# Patient Record
Sex: Female | Born: 1990
Health system: Southern US, Community
[De-identification: ages and names within clinical notes are randomized; demographics above are authoritative.]

## PROBLEM LIST (undated history)

## (undated) DIAGNOSIS — F419 Anxiety disorder, unspecified: Secondary | ICD-10-CM

## (undated) DIAGNOSIS — L709 Acne, unspecified: Secondary | ICD-10-CM

## (undated) HISTORY — PX: WISDOM TOOTH EXTRACTION: SHX21

## (undated) HISTORY — DX: Anxiety disorder, unspecified: F41.9

## (undated) HISTORY — PX: UPPER GI ENDOSCOPY: SHX6162

## (undated) HISTORY — PX: COLONOSCOPY: SHX174

## (undated) HISTORY — DX: Acne, unspecified: L70.9

---
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
## 2011-01-06 ENCOUNTER — Encounter: Payer: Self-pay | Admitting: Family Medicine

## 2011-01-06 ENCOUNTER — Ambulatory Visit (INDEPENDENT_AMBULATORY_CARE_PROVIDER_SITE_OTHER): Payer: Managed Care, Other (non HMO) | Admitting: Family Medicine

## 2011-01-06 VITALS — BP 100/70 | Temp 98.8°F | Wt 140.2 lb

## 2011-01-06 DIAGNOSIS — N760 Acute vaginitis: Secondary | ICD-10-CM

## 2011-01-06 MED ORDER — FLUCONAZOLE 150 MG PO TABS: 150.0000 mg | ORAL_TABLET | Freq: Once | ORAL | Status: AC

## 2011-01-06 NOTE — Patient Instructions (Signed)
This appears to be yeast Take the Diflucan- 1 today and then if you still have symptoms take the 2nd pill in 3 days We'll notify you of your lab results Call with any questions or concerns Use condoms- EVERY TIME! Welcome home!

## 2011-01-06 NOTE — Progress Notes (Signed)
  Subjective:    Patient ID: Tasha Hanna, female    DOB: [birth year], 20 y.o.   MRN: [id number]  HPI ? Yeast infxn- having vaginal itching and burning.  sxs started last night.  No recent bubble baths, change in soaps or tampons.  Just finished menses.  Having thick, white vaginal d/c.  Denies abdominal pain.  No concerns about STDs.   Review of Systems For ROS see HPI     Objective:   Physical Exam  Genitourinary: There is no rash on the right labia. There is no rash on the left labia. Cervix exhibits no motion tenderness, no discharge and no friability. No tenderness or bleeding around the vagina. Vaginal discharge found.       Thick, white vaginal discharge w/ visible yeast on internal walls          Assessment & Plan:

## 2011-01-07 ENCOUNTER — Encounter: Payer: Self-pay | Admitting: *Deleted

## 2011-01-07 LAB — WET PREP BY MOLECULAR PROBE: Gardnerella vaginalis: NEGATIVE

## 2011-01-11 NOTE — Assessment & Plan Note (Signed)
Pt's sxs and PE consistent w/ yeast.  Check labs to r/o STD given episode of unprotected sex.  Start diflucan and await other results.

## 2011-01-24 ENCOUNTER — Ambulatory Visit (INDEPENDENT_AMBULATORY_CARE_PROVIDER_SITE_OTHER): Payer: Managed Care, Other (non HMO) | Admitting: Family Medicine

## 2011-01-24 VITALS — BP 120/70 | Temp 99.0°F | Wt 143.1 lb

## 2011-01-24 DIAGNOSIS — R599 Enlarged lymph nodes, unspecified: Secondary | ICD-10-CM

## 2011-01-24 DIAGNOSIS — R590 Localized enlarged lymph nodes: Secondary | ICD-10-CM | POA: Insufficient documentation

## 2011-01-24 NOTE — Progress Notes (Signed)
  Subjective:    Patient ID: Tasha Hanna, female    DOB: 1991-01-09, 20 y.o.   MRN: [id number]  HPI Knot behind ear- first noticed 2 months ago.  Is increasing in size.  Not painful.  No drainage.  No hx of similar.  No other areas of concerns.   Review of Systems For ROS see HPI     Objective:   Physical Exam  Constitutional: She appears well-developed and well-nourished. No distress.  HENT:  Head: Normocephalic and atraumatic.  Nose: Nose normal.  Mouth/Throat: Oropharynx is clear and moist. No oropharyngeal exudate.  Lymphadenopathy:       Head (right side): No submental, no submandibular, no preauricular, no posterior auricular and no occipital adenopathy present.       Head (left side): Posterior auricular adenopathy present. No submental, no submandibular, no preauricular and no occipital adenopathy present.       Right cervical: No superficial cervical and no posterior cervical adenopathy present.      Left cervical: No superficial cervical and no posterior cervical adenopathy present.       Right: No supraclavicular adenopathy present.       Left: No supraclavicular adenopathy present.          Assessment & Plan:

## 2011-01-24 NOTE — Patient Instructions (Signed)
We'll call you with your ultrasound appt and notify you of your blood work At this time, there is nothing to do but watch it and wait Call with any questions or concerns Have a great summer!!

## 2011-01-24 NOTE — Assessment & Plan Note (Signed)
Most likely due to irritation from tattooing process and now possibly calcified.  Check CBC and get [place].  If labs and imaging normal will just continue to observe.

## 2011-01-25 ENCOUNTER — Ambulatory Visit (HOSPITAL_BASED_OUTPATIENT_CLINIC_OR_DEPARTMENT_OTHER)
Admission: RE | Admit: 2011-01-25 | Discharge: 2011-01-25 | Disposition: A | Discharge status: Home / Self Care | Payer: Managed Care, Other (non HMO) | Source: Ambulatory Visit | Attending: Family Medicine | Admitting: Family Medicine

## 2011-01-25 DIAGNOSIS — R221 Localized swelling, mass and lump, neck: Secondary | ICD-10-CM

## 2011-01-25 DIAGNOSIS — R220 Localized swelling, mass and lump, head: Secondary | ICD-10-CM

## 2011-01-25 DIAGNOSIS — R590 Localized enlarged lymph nodes: Secondary | ICD-10-CM

## 2011-01-25 LAB — CBC WITH DIFFERENTIAL/PLATELET
Basophils Relative: 0.4 % (ref 0.0–3.0)
Eosinophils Absolute: 0.1 10*3/uL (ref 0.0–0.7)
Eosinophils Relative: 2.6 % (ref 0.0–5.0)
HCT: 37.7 % (ref 36.0–46.0)
Hemoglobin: 12.8 g/dL (ref 12.0–15.0)
Lymphs Abs: 1.3 10*3/uL (ref 0.7–4.0)
MCHC: 33.9 g/dL (ref 30.0–36.0)
MCV: 83.6 fl (ref 78.0–100.0)
Monocytes Absolute: 0.3 10*3/uL (ref 0.1–1.0)
Neutro Abs: 2.3 10*3/uL (ref 1.4–7.7)
Neutrophils Relative %: 56.4 % (ref 43.0–77.0)
RBC: 4.5 Mil/uL (ref 3.87–5.11)
WBC: 4.1 10*3/uL — ABNORMAL LOW (ref 4.5–10.5)

## 2011-01-26 ENCOUNTER — Encounter: Payer: Self-pay | Admitting: *Deleted

## 2011-01-26 ENCOUNTER — Other Ambulatory Visit (HOSPITAL_BASED_OUTPATIENT_CLINIC_OR_DEPARTMENT_OTHER): Payer: Managed Care, Other (non HMO)

## 2011-03-07 ENCOUNTER — Encounter: Payer: Self-pay | Admitting: Family Medicine

## 2011-03-08 ENCOUNTER — Ambulatory Visit (INDEPENDENT_AMBULATORY_CARE_PROVIDER_SITE_OTHER): Payer: Managed Care, Other (non HMO) | Admitting: Internal Medicine

## 2011-03-08 VITALS — BP 110/70 | Temp 98.8°F | Wt 138.6 lb

## 2011-03-08 DIAGNOSIS — M791 Myalgia, unspecified site: Secondary | ICD-10-CM

## 2011-03-08 DIAGNOSIS — M6789 Other specified disorders of synovium and tendon, multiple sites: Secondary | ICD-10-CM

## 2011-03-08 MED ORDER — TRAMADOL HCL 50 MG PO TABS: 50.0000 mg | ORAL_TABLET | Freq: Four times a day (QID) | ORAL | Status: AC | PRN

## 2011-03-08 NOTE — Patient Instructions (Signed)
Always stretch and warmup before lifting weights. Use the pain medicine every 6 hours as needed and implement the other recommendations as noted.

## 2011-03-08 NOTE — Progress Notes (Signed)
  Subjective:    Patient ID: Tasha Hanna, female    DOB: 29-Aug-1990, 20 y.o.   MRN: [id number]  HPI Extremity pain Location:LUE in biceps & upper forearm Onset:7/14 Trigger/injury:working out 7/13 with weights, "more intense than usual". She did not hear or feel a "pop" Pain quality:sharp Pain severity:up to 7 Duration:only as long as arm extended Radiation:no Exacerbating factors:reaching out Treatment/response:NSAIDS, heat, massage w/o benefit Review of systems: Constitutional: no fever, chills, sweats, change in weight  Musculoskeletal:no    joint stiffness, redness. Elbow swollen swelling Skin:no rash, color or temperature change Neuro:  Some weakness; no numbness and tingling Heme:no lymphadenopathy; abnormal bruising or bleeding       Review of Systems     Objective:   Physical Exam she is in no acute distress; she is uncomfortable  She has no lymphadenopathy the head neck or axilla.  There is full range of motion of the neck.  Passive range of motion left upper extremity is normal. She describes pain when she extends the left upper extremity forward  She is tender over the biceps tendon and the biceps to palpation. Contracted biceps reveals no asymmetry compared to the right biceps.  Strength and deep tendon reflexes are normal.  No skin color change or temperature noted.  She has an exostosis below the right knee which she said is chronic.        Assessment & Plan:  #1 biceps tendon strain; no clinical evidence of muscle  or tendon rupture  Plan: Warm moist compresses or Zostrix cream to this area 3-4 X / day. Tramadol every 6 hours as needed. Light stretching with low weight (2 #). Glucosamine sulfate 1500 mg daily until the pain resolves

## 2011-04-04 ENCOUNTER — Ambulatory Visit (INDEPENDENT_AMBULATORY_CARE_PROVIDER_SITE_OTHER): Payer: Managed Care, Other (non HMO) | Admitting: Family Medicine

## 2011-04-04 ENCOUNTER — Encounter: Payer: Self-pay | Admitting: Family Medicine

## 2011-04-04 DIAGNOSIS — Z111 Encounter for screening for respiratory tuberculosis: Secondary | ICD-10-CM

## 2011-04-04 DIAGNOSIS — Z0110 Encounter for examination of ears and hearing without abnormal findings: Secondary | ICD-10-CM

## 2011-04-04 DIAGNOSIS — Z23 Encounter for immunization: Secondary | ICD-10-CM

## 2011-04-04 DIAGNOSIS — Z0000 Encounter for general adult medical examination without abnormal findings: Secondary | ICD-10-CM | POA: Insufficient documentation

## 2011-04-04 DIAGNOSIS — Z0100 Encounter for examination of eyes and vision without abnormal findings: Secondary | ICD-10-CM

## 2011-04-04 NOTE — Assessment & Plan Note (Signed)
Pt's PE WNL.  According to form for school she needs TB test, TDaP, and varicella titers.  All other requirements met.  Form completed.  Will mail results of titers.  Anticipatory guidance provided.

## 2011-04-04 NOTE — Progress Notes (Signed)
  Subjective:    Patient ID: Tasha Hanna, female    DOB: 04/16/1991, 20 y.o.   MRN: [id number]  HPI CPE- no concerns today.     Review of Systems Patient reports no vision/ hearing changes, adenopathy,fever, weight change,  persistant/recurrent hoarseness , swallowing issues, chest pain, palpitations, edema, persistant/recurrent cough, hemoptysis, dyspnea (rest/exertional/paroxysmal nocturnal), gastrointestinal bleeding (melena, rectal bleeding), abdominal pain, significant heartburn, bowel changes, GU symptoms (dysuria, hematuria, incontinence), Gyn symptoms (abnormal  bleeding, pain),  syncope, focal weakness, memory loss, numbness & tingling, skin/hair/nail changes, abnormal bruising or bleeding, anxiety, or depression.     Objective:   Physical Exam  General Appearance:    Alert, cooperative, no distress, appears stated age  Head:    Normocephalic, without obvious abnormality, atraumatic  Eyes:    PERRL, conjunctiva/corneas clear, EOM's intact, fundi    benign, both eyes  Ears:    Normal TM's and external ear canals, both ears  Nose:   Nares normal, septum midline, mucosa normal, no drainage    or sinus tenderness  Throat:   Lips, mucosa, and tongue normal; teeth and gums normal  Neck:   Supple, symmetrical, trachea midline, no adenopathy;    Thyroid: no enlargement/tenderness/nodules  Back:     Symmetric, no curvature, ROM normal, no CVA tenderness  Lungs:     Clear to auscultation bilaterally, respirations unlabored  Chest Wall:    No tenderness or deformity   Heart:    Regular rate and rhythm, S1 and S2 normal, no murmur, rub   or gallop  Breast Exam:    deferred  Abdomen:     Soft, non-tender, bowel sounds active all four quadrants,    no masses, no organomegaly  Genitalia:    Deferred  Rectal:    Deferred  Extremities:   Extremities normal, atraumatic, no cyanosis or edema  Pulses:   2+ and symmetric all extremities  Skin:   Skin color, texture, turgor normal, no rashes or  lesions  Lymph nodes:   Cervical, supraclavicular, and axillary nodes normal  Neurologic:   CNII-XII intact, normal strength, sensation and reflexes    throughout          Assessment & Plan:

## 2011-04-04 NOTE — Patient Instructions (Signed)
Schedule a nurse visit on Wed or Thurs to recheck TB test [facility] notify you of your lab results Call with any questions or concerns Good luck at school!

## 2011-04-07 ENCOUNTER — Telehealth: Payer: Self-pay

## 2011-04-07 LAB — TB SKIN TEST: Induration: 0

## 2011-04-07 NOTE — Telephone Encounter (Signed)
Message copied by Beverely Low on Thu Apr 07, 2011  4:26 PM ------      Message from: Sheliah Hatch      Created: Tue Apr 05, 2011  1:51 PM       Immune to chicken pox- please mail copy to pt

## 2011-04-07 NOTE — Telephone Encounter (Signed)
Labs mailed

## 2011-05-05 ENCOUNTER — Other Ambulatory Visit: Payer: Self-pay | Admitting: Family Medicine

## 2011-05-05 MED ORDER — NORGESTIMATE-ETH ESTRADIOL 0.25-35 MG-MCG PO TABS: 1.0000 | ORAL_TABLET | Freq: Every day | ORAL | Status: DC

## 2011-05-05 NOTE — Telephone Encounter (Signed)
Rx sent 

## 2011-06-13 ENCOUNTER — Other Ambulatory Visit: Payer: Self-pay | Admitting: Family Medicine

## 2011-06-15 ENCOUNTER — Other Ambulatory Visit: Payer: Self-pay | Admitting: *Deleted

## 2011-06-15 MED ORDER — NORGESTIMATE-ETH ESTRADIOL 0.25-35 MG-MCG PO TABS: 1.0000 | ORAL_TABLET | Freq: Every day | ORAL | Status: DC

## 2011-06-15 NOTE — Telephone Encounter (Signed)
Pt called for refill on Monoessa 28's noted no pap in record, called to ask if she has a gyn and when she had last pap. Left message for pt to call office

## 2011-06-15 NOTE — Telephone Encounter (Signed)
rx sent to pharmacy by e-script  

## 2011-06-15 NOTE — Telephone Encounter (Signed)
At 20 she may not need a pap.  1st pap should be 3 yrs after intercourse or age 20- whichever comes first.  Ok to refill.

## 2011-06-16 NOTE — Telephone Encounter (Signed)
Done 06/15/11

## 2011-07-18 IMAGING — US US SOFT TISSUE HEAD/NECK
1 series · 7 of 7 positions shown · non-contrast
Comparison: None.

CLINICAL DATA: The patient complains of a probable lump posterior
to the left ear for 2 months

ULTRASOUND OF HEAD/NECK SOFT TISSUES
TECHNIQUE: Ultrasound examination of the head and neck soft
tissues was performed in the area of clinical concern.

[Series 1: us soft tissue head/neck · 0.04mm/px · 7 of 7 slices shown]
[im 1/7]
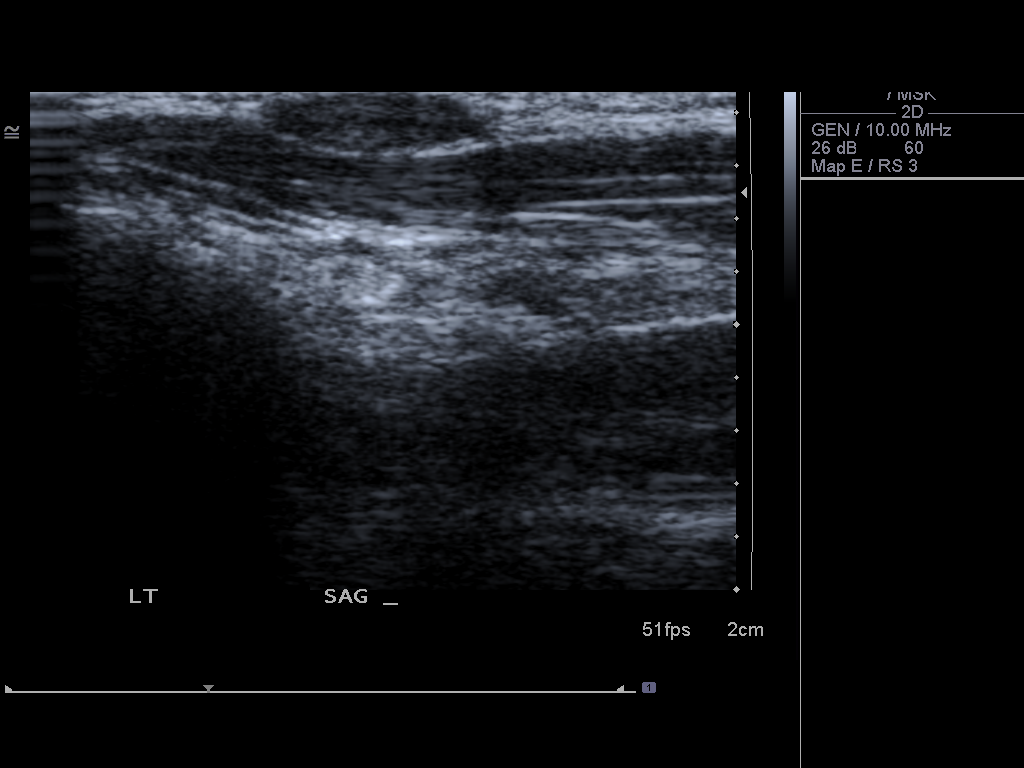
[im 2/7]
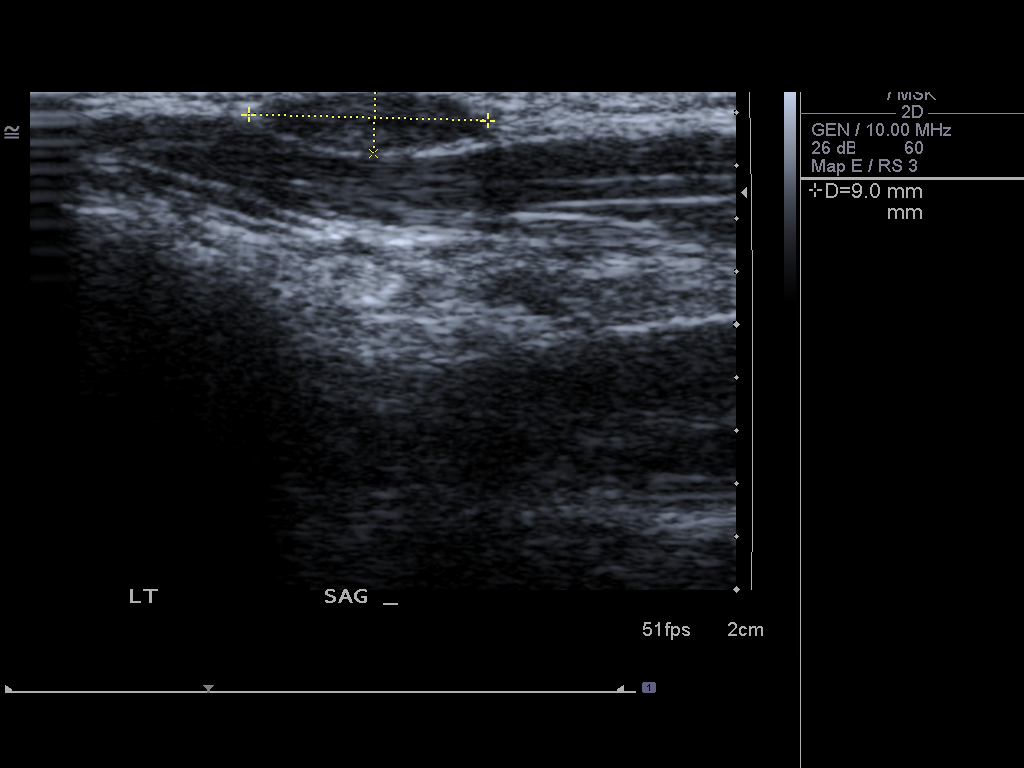
[im 3/7]
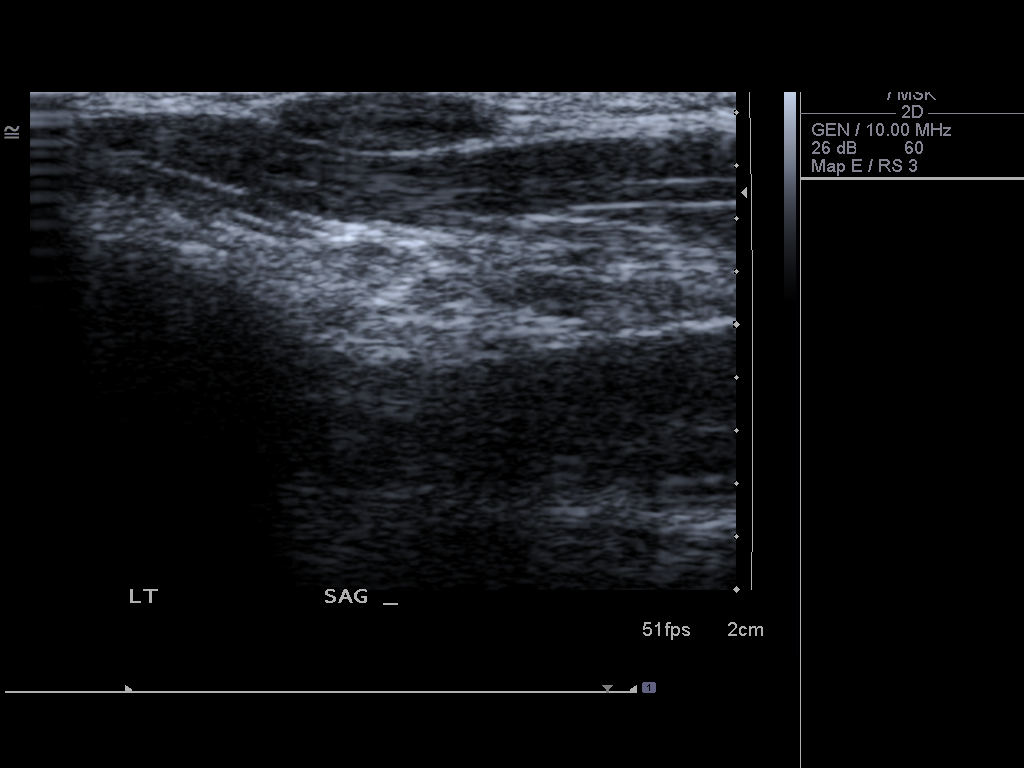
[im 4/7]
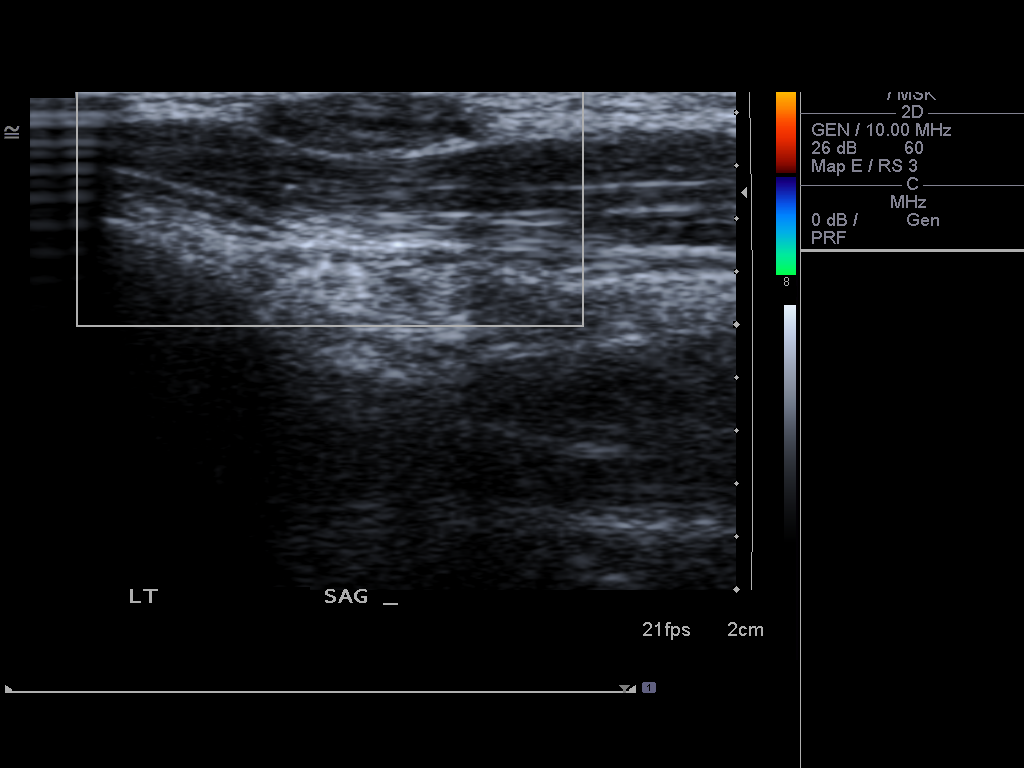
[im 5/7]
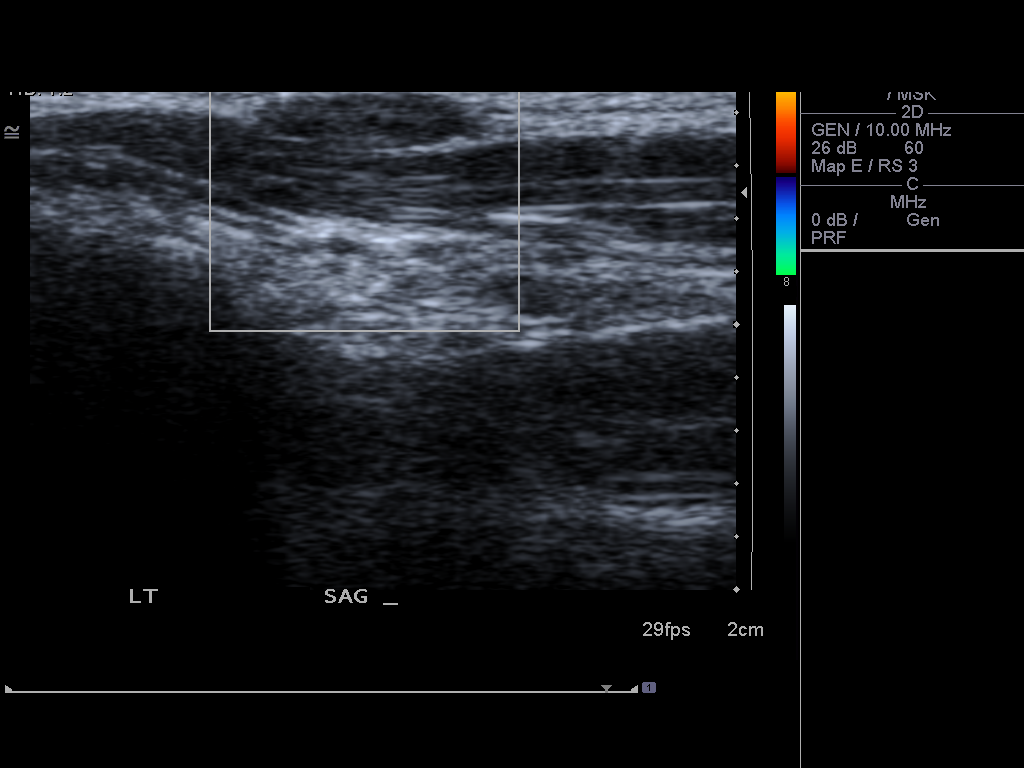
[im 6/7]
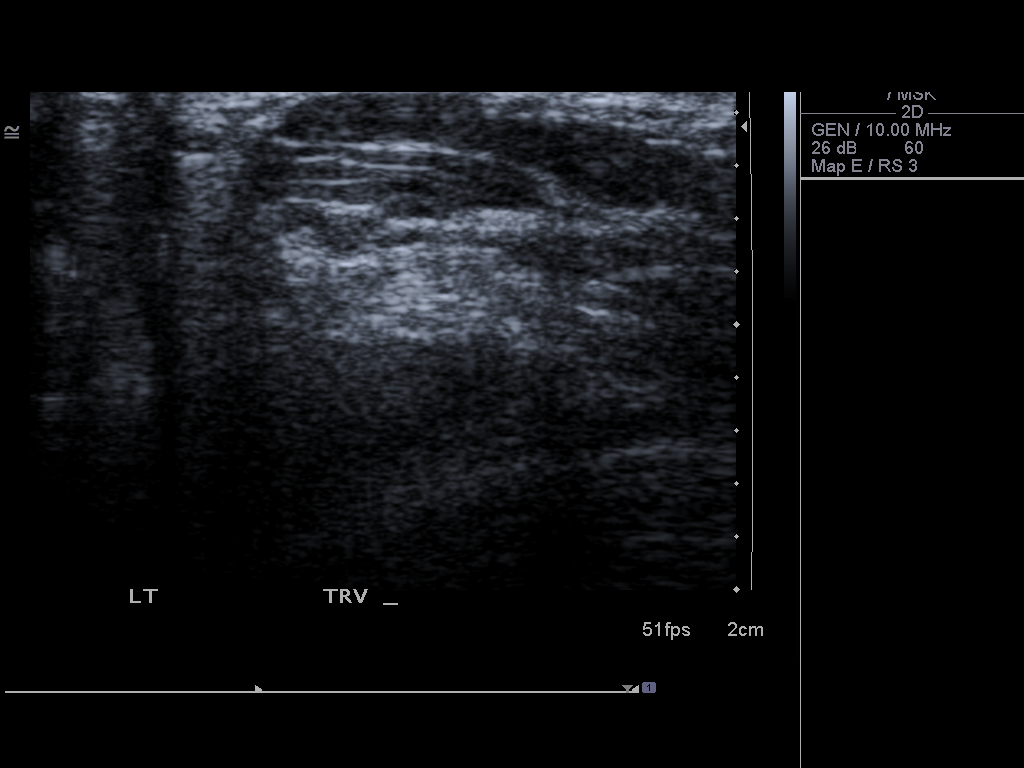
[im 7/7]
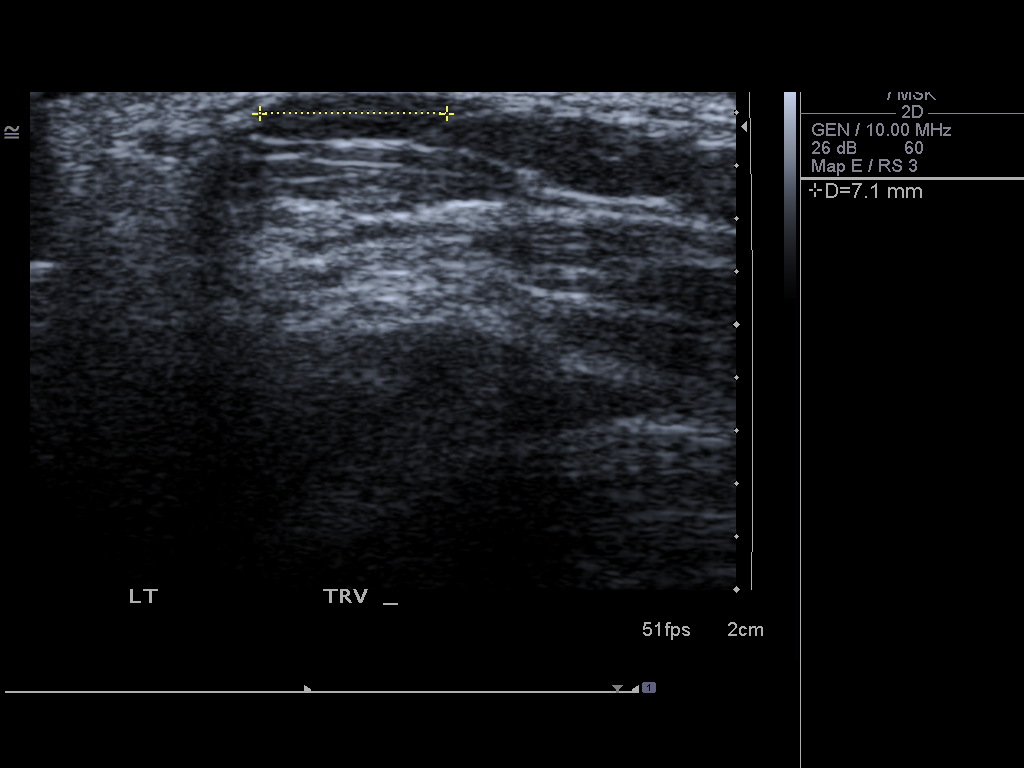

[7 of 7 positions shown; findings below may reference images not displayed]

FINDINGS: There is an oval relatively hypoechoic soft tissue nodule
corresponding to the palpable abnormality.  This is most consistent
with a lymph node of 9 x 3 x 7 mm.  No definite blood flow could be
demonstrated.
IMPRESSION: Probable lymph node at the site in question posterior to the left
ear.

## 2012-01-25 ENCOUNTER — Encounter: Payer: Self-pay | Admitting: Family Medicine

## 2012-01-25 ENCOUNTER — Ambulatory Visit (INDEPENDENT_AMBULATORY_CARE_PROVIDER_SITE_OTHER): Payer: Managed Care, Other (non HMO) | Admitting: Family Medicine

## 2012-01-25 VITALS — BP 120/75 | HR 77 | Temp 98.4°F | Ht 66.25 in | Wt 134.4 lb

## 2012-01-25 DIAGNOSIS — IMO0001 Reserved for inherently not codable concepts without codable children: Secondary | ICD-10-CM

## 2012-01-25 DIAGNOSIS — M2170 Unequal limb length (acquired), unspecified site: Secondary | ICD-10-CM

## 2012-01-25 DIAGNOSIS — Z309 Encounter for contraceptive management, unspecified: Secondary | ICD-10-CM

## 2012-01-25 MED ORDER — NORGESTIMATE-ETH ESTRADIOL 0.25-35 MG-MCG PO TABS: 1.0000 | ORAL_TABLET | Freq: Every day | ORAL | Status: DC

## 2012-01-25 NOTE — Assessment & Plan Note (Signed)
New.  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  L leg appears to be 1-2 cm longer.  Will refer to sports med for complete evaluation and tx.  Pt expressed understanding and is in agreement w/ plan.

## 2012-01-25 NOTE — Patient Instructions (Signed)
Schedule CPE when you get back Someone will call you with your sports med appt Have a great time!

## 2012-01-25 NOTE — Progress Notes (Signed)
  Subjective:    Patient ID: Tasha Hanna, female    DOB: 1991/05/13, 21 y.o.   MRN: [id number]  HPI Going to [place] to study abroad.  Leaving 7/29.  Will be there until 12/23 (5 months).  Was told she will need 5 month supply and note from MD indicating what medication is and what it is for in order to take in carry-on.  Leg Length Inequality- mom feels R hip is 'jutting out'.  Pt reports she regularly 'favors' one leg.  Has had increased back pain recently.  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 Constitutional: She appears well-developed and well-nourished. No distress.  Musculoskeletal: Normal range of motion. She exhibits no edema and no tenderness.       R leg appears to be 1.5 cm longer at medial malleoli L leg appears mildly bowlegged R hip jutting out more prominently than L  Psychiatric: She has a normal mood and affect. Her behavior is normal. Thought content normal.          Assessment & Plan:

## 2012-01-25 NOTE — Assessment & Plan Note (Signed)
Pt given new script for 6 pill packs at 1 time and letter for [employer] indicating why she is bringing large quantity of meds.  Pt to have CPE when she returns.

## 2012-02-01 ENCOUNTER — Encounter: Payer: Self-pay | Admitting: Family Medicine

## 2012-02-01 ENCOUNTER — Ambulatory Visit (INDEPENDENT_AMBULATORY_CARE_PROVIDER_SITE_OTHER): Payer: Managed Care, Other (non HMO) | Admitting: Family Medicine

## 2012-02-01 VITALS — BP 125/79 | HR 74 | Temp 98.1°F | Ht 66.0 in | Wt 130.0 lb

## 2012-02-01 DIAGNOSIS — M2170 Unequal limb length (acquired), unspecified site: Secondary | ICD-10-CM

## 2012-02-01 DIAGNOSIS — M545 Low back pain, unspecified: Secondary | ICD-10-CM

## 2012-02-01 DIAGNOSIS — R269 Unspecified abnormalities of gait and mobility: Secondary | ICD-10-CM

## 2012-02-01 DIAGNOSIS — M21619 Bunion of unspecified foot: Secondary | ICD-10-CM

## 2012-02-01 NOTE — Patient Instructions (Addendum)
While the measurement of your legs demonstrates they are equal, because of your pelvic tilt the left one appears slightly longer. We will try heel lift on this side as well as scaphoid pads to prevent overpronation. Bunion pads and 1st toe spacers to help with your bunion. Try SI joint stretches to help with stiffness in your low back. If these work well for you, I can show you how to order these pads from [employer] or make you custom orthotics.

## 2012-02-02 ENCOUNTER — Encounter: Payer: Self-pay | Admitting: Family Medicine

## 2012-02-02 NOTE — Progress Notes (Signed)
Subjective:    Patient ID: Tasha Hanna, female    DOB: 07-24-1991, 21 y.o.   MRN: [id number]  PCP: Dr. Beverely Low  HPI 21 yo F here for low back, left lower extremity complaints.  Patient here with mother. They state for the past few years she has suffered from intermittent upper back, low back, left knee, right knee, left foot pain.  Most of symptoms occur on left side. They do improve with rest. She turns left foot in more than the right. No prior treatment or surgeries for any of these areas. Known prior osgood schlatters on right. Occasionally takes advil. No complaints currently. No numbness/tingling of extremities. No bowel/bladder dysfunction. Questionable leg length discrepancy at recent visit with PCP.   Past Medical History  Diagnosis Date  . Acne     Dr Sherryl Barters NP    Current Outpatient Prescriptions on File Prior to Visit  Medication Sig Dispense Refill  . norgestimate-ethinyl estradiol (SPRINTEC 28) 0.25-35 MG-MCG tablet Take 1 tablet by mouth daily. Please provide 6 months b/c pt will be studying abroad  6 Package  0  . traMADol (ULTRAM) 50 MG tablet Take 1 tablet (50 mg total) by mouth every 6 (six) hours as needed for pain.  30 tablet  0    Past Surgical History  Procedure Date  . Wisdom tooth extraction     No Known Allergies  History   Social History  . Marital Status: Single    Spouse Name: N/A    Number of Children: N/A  . Years of Education: N/A   Occupational History  . Not on file.   Social History Main Topics  . Smoking status: Never Smoker   . Smokeless tobacco: Not on file  . Alcohol Use: Yes  . Drug Use: No  . Sexually Active: Not on file   Other Topics Concern  . Not on file   Social History Narrative   Lives at home with mom, dad, and brother.    Family History  Problem Relation Age of Onset  . Hypertension Father   . Diabetes Maternal Grandfather   . Heart attack Maternal Grandfather   . Stroke Maternal Grandfather   .  Cancer Maternal Grandfather     Bladder  . Prostate cancer Maternal Grandfather   . Diabetes Paternal Grandfather     BP 125/79  Pulse 74  Temp 98.1 F (36.7 C) (Oral)  Ht 5\' 6"  (1.676 m)  Wt 130 lb (58.968 kg)  BMI 20.98 kg/m2  LMP 01/11/2012  Review of Systems See HPI above.    Objective:   Physical Exam Gen: NAD  Back: No gross deformity, scoliosis. No paraspinal TTP .  No midline or bony TTP. FROM. Strength LEs 5/5 all muscle groups including hip abduction.   2+ MSRs in patellar and achilles tendons, equal bilaterally. Negative SLRs. Sensation intact to light touch bilaterally. Negative logroll bilateral hips Negative fabers and piriformis stretches though SI joints without much movement bilaterally with fabers. Leg lengths measured 86 1/2 cm from ASIS to medial malleolus bilaterally though visibly left appears 0.5cm shorter with feet aligned with hips. No trendelenberg gait.  L foot/ankle: Left bunion and hallux valgus. Mod overpronation on left, mild on right. No other gross deformity, swelling, ecchymoses FROM No TTP. Negative ant drawer and talar tilt.   Negative syndesmotic compression. Thompsons test negative. NV intact distally.  L knee: No gross deformity, ecchymoses, swelling. No TTP. FROM. Negative ant/post drawers. Negative valgus/varus testing. Negative lachmanns. Negative mcmurrays,  apleys, patellar apprehension. NV intact distally.    Assessment & Plan:  1. Low back pain - only abnormality currently on exam is SI joints are stiff - shown stretching exercises.  Discussed hip abduction, rotation exercises though her strength on exam is excellent grossly.  Footwear adjustments hopefully will help this as well.  2. Gait abnormality - By measurement leg lengths are equal though left appears about 0.5cm shorter when aligning with hips and evaluating location of medial malleoli.  As she does not have evidence of scoliosis this would suggest  imbalance related to pelvic tilt.  Will try heel lift on left with scaphoid pads bilaterally to help with overpronation.  3. Bunion - toe spacers given and bunion pad as well.

## 2012-02-06 DIAGNOSIS — R269 Unspecified abnormalities of gait and mobility: Secondary | ICD-10-CM | POA: Insufficient documentation

## 2012-02-06 DIAGNOSIS — M545 Low back pain, unspecified: Secondary | ICD-10-CM | POA: Insufficient documentation

## 2012-02-06 DIAGNOSIS — M21619 Bunion of unspecified foot: Secondary | ICD-10-CM | POA: Insufficient documentation

## 2012-02-06 NOTE — Assessment & Plan Note (Signed)
By measurement leg lengths are equal though left appears about 0.5cm shorter when aligning with hips and evaluating location of medial malleoli.  As she does not have evidence of scoliosis this would suggest imbalance related to pelvic tilt.  Will try heel lift on left with scaphoid pads bilaterally to help with overpronation.

## 2012-02-06 NOTE — Assessment & Plan Note (Signed)
toe spacers given and bunion pad as well.

## 2012-02-06 NOTE — Assessment & Plan Note (Signed)
only abnormality currently on exam is SI joints are stiff - shown stretching exercises.  Discussed hip abduction, rotation exercises though her strength on exam is excellent grossly.  Footwear adjustments hopefully will help this as well.

## 2012-03-13 ENCOUNTER — Telehealth: Payer: Self-pay | Admitting: Family Medicine

## 2012-03-13 NOTE — Telephone Encounter (Signed)
Patients mother states the pt is leaving the country and needs to get her birth control filled for 5 months. However, [employer] will only cover a 3 month supply. Pts mother is [facility] [address] and is inquiring if she can get 2 more refills and pay out of pocket. Please advise,

## 2012-03-14 MED ORDER — NORGESTIMATE-ETH ESTRADIOL 0.25-35 MG-MCG PO TABS
1.0000 | ORAL_TABLET | Freq: Every day | ORAL | Status: DC
Start: 2012-03-14 — End: 2012-03-14

## 2012-03-14 MED ORDER — NORGESTIMATE-ETH ESTRADIOL 0.25-35 MG-MCG PO TABS: 1.0000 | ORAL_TABLET | Freq: Every day | ORAL | Status: DC

## 2012-03-14 NOTE — Telephone Encounter (Signed)
Tell pt to take script to [facility] and she doesn't need to run it through insurance and it is $10

## 2012-03-14 NOTE — Telephone Encounter (Signed)
Called pt mother to advise notation from pt recent OV as follows:  Pt given new script for 6 pill packs at 1 time and letter for [employer] indicating why she is bringing large quantity of meds. Pt to have CPE when she returns. Also noted the RX was sent to [facility], pt mother advised the insurance would only overide for 3 packages and was in search to find out if she could take the hard script for the remaining 2 with her to [place] to be filled, advised that I will ask MD Beverely Low if she is aware of any protocol for out of country Rx refills and contact pt back, at this time the 3packages have been sent via [facility] [address] please advise

## 2012-03-14 NOTE — Telephone Encounter (Signed)
Pt advised that she is already going to pick up the [facility] and wants the other 2 refills sent to  [facility] [address], sent via escribe and added notation to not fill through insurance. Pt understood.

## 2012-09-13 ENCOUNTER — Ambulatory Visit (INDEPENDENT_AMBULATORY_CARE_PROVIDER_SITE_OTHER): Payer: Managed Care, Other (non HMO) | Admitting: Family Medicine

## 2012-09-13 ENCOUNTER — Encounter: Payer: Self-pay | Admitting: Family Medicine

## 2012-09-13 VITALS — BP 120/86 | HR 81 | Wt 147.0 lb

## 2012-09-13 DIAGNOSIS — R1013 Epigastric pain: Secondary | ICD-10-CM

## 2012-09-13 MED ORDER — SUCRALFATE 1 G PO TABS: 1.0000 g | ORAL_TABLET | Freq: Four times a day (QID) | ORAL | Status: DC

## 2012-09-13 MED ORDER — OMEPRAZOLE 20 MG PO CPDR: 20.0000 mg | DELAYED_RELEASE_CAPSULE | Freq: Every day | ORAL | Status: DC

## 2012-09-13 NOTE — Assessment & Plan Note (Signed)
New.  Suspect gastritis due to recent binge drinking w/ excessive vomiting.  Due to epigastric TTP will check pancreatic enzymes.  Due to ETOH use will check LFTs.  Start PPI and carafate short term to allow mucosal healing.  Cautioned pt against binge drinking.  Will follow.

## 2012-09-13 NOTE — Progress Notes (Signed)
  Subjective:    Patient ID: Tasha Hanna, female    DOB: [birth year], 22 y.o.   MRN: [id number]  HPI abd pain- sxs started 1 week ago after getting sick from drinking ETOH.  Excessive vomiting per friends report but pt doesn't recall.  Has had epigastric pain and nausea since.  No improvement w/ eating.  Some improvement w/ sprite.  No fevers.  No diarrhea, constipation.  No TTP.  No radiation of pain into chest.  No hx of similar.  Has not drank since.  Prior to drinking binge was in [place] for 5 months and drinking sporadically.   Review of Systems For ROS see HPI     Objective:   Physical Exam  Constitutional: She is oriented to person, place, and time. She appears well-developed and well-nourished. No distress.  HENT:  Head: Normocephalic and atraumatic.  Eyes: Conjunctivae normal and EOM are normal. Pupils are equal, round, and reactive to light.  Neck: Normal range of motion. Neck supple. No thyromegaly present.  Cardiovascular: Normal rate, regular rhythm, normal heart sounds and intact distal pulses.   No murmur heard. Pulmonary/Chest: Effort normal and breath sounds normal. No respiratory distress.  Abdominal: Soft. Bowel sounds are normal. She exhibits no distension and no mass. There is tenderness (mild epigastric TTP). There is no rebound and no guarding.  Musculoskeletal: She exhibits no edema.  Lymphadenopathy:    She has no cervical adenopathy.  Neurological: She is alert and oriented to person, place, and time.  Skin: Skin is warm and dry.  Psychiatric: She has a normal mood and affect. Her behavior is normal.          Assessment & Plan:

## 2012-09-13 NOTE — Patient Instructions (Addendum)
Start the Omeprazole daily to decrease acid Use the Carafate prior to eating and before bed to protect the stomach Limit alcohol, acidic (tomatos, citrus), and spicy foods until you feel better Drink plenty of fluids We'll notify you of your lab results Call with any questions or concerns Hang in there!

## 2012-09-14 ENCOUNTER — Encounter: Payer: Self-pay | Admitting: *Deleted

## 2012-09-14 ENCOUNTER — Telehealth: Payer: Self-pay | Admitting: Family Medicine

## 2012-09-14 LAB — BASIC METABOLIC PANEL
BUN: 11 mg/dL (ref 6–23)
CO2: 27 mEq/L (ref 19–32)
Calcium: 9.8 mg/dL (ref 8.4–10.5)
Creatinine, Ser: 0.7 mg/dL (ref 0.4–1.2)
GFR: 116.74 mL/min (ref >60.00)
Glucose, Bld: 94 mg/dL (ref 70–99)

## 2012-09-14 LAB — CBC WITH DIFFERENTIAL/PLATELET
Basophils Relative: 0.2 % (ref 0.0–3.0)
Eosinophils Relative: 0.8 % (ref 0.0–5.0)
HCT: 42.9 % (ref 36.0–46.0)
Hemoglobin: 14.3 g/dL (ref 12.0–15.0)
Lymphs Abs: 3.1 10*3/uL (ref 0.7–4.0)
MCV: 87.6 fl (ref 78.0–100.0)
Monocytes Absolute: 0.3 10*3/uL (ref 0.1–1.0)
Monocytes Relative: 3.5 % (ref 3.0–12.0)
Neutro Abs: 5.1 10*3/uL (ref 1.4–7.7)
WBC: 8.7 10*3/uL (ref 4.5–10.5)

## 2012-09-14 LAB — HEPATIC FUNCTION PANEL
Albumin: 4.6 g/dL (ref 3.5–5.2)
Alkaline Phosphatase: 50 U/L (ref 39–117)
Total Protein: 8.5 g/dL — ABNORMAL HIGH (ref 6.0–8.3)

## 2012-09-14 LAB — LIPASE: Lipase: 19 U/L (ref 11.0–59.0)

## 2012-09-14 LAB — AMYLASE: Amylase: 54 U/L (ref 27–131)

## 2012-09-14 LAB — H. PYLORI ANTIBODY, IGG: H Pylori IgG: NEGATIVE

## 2012-09-14 MED ORDER — SUCRALFATE 1 G PO TABS: 1.0000 g | ORAL_TABLET | Freq: Four times a day (QID) | ORAL | Status: DC

## 2012-09-14 MED ORDER — OMEPRAZOLE 20 MG PO CPDR: 20.0000 mg | DELAYED_RELEASE_CAPSULE | Freq: Every day | ORAL | Status: DC

## 2012-09-14 NOTE — Telephone Encounter (Signed)
mom called stated none of the RXs have been received at the pharmacy. reviewed pharmacy it shows sent to pt stated they do not use  I have updated pts chart to reflect the pnly pharmacy they use which is below. Can both be sent today to the phamracy below??   [id number] - [place], [place] - [address]

## 2012-09-14 NOTE — Telephone Encounter (Signed)
Rx sent to correct pharmacy, all other pharmacy deleted from Pt profile.

## 2012-09-19 ENCOUNTER — Telehealth: Payer: Self-pay | Admitting: *Deleted

## 2012-09-19 NOTE — Telephone Encounter (Signed)
Called initiated PA awaiting fax form.

## 2012-09-24 NOTE — Telephone Encounter (Signed)
Form completed and faxed, per Dr Beverely Low Pt can try OTC. Pt made aware.

## 2012-09-28 NOTE — Telephone Encounter (Signed)
PA was denied because current plan does not allow coverage of Prilosec if the patient does not have any of the following frequent and severe symptoms of chronic gastroesophageal reflux disease GERD; Atypical symptoms or complications of GERD; GERD with an inadequate treatment. Denial letter scan to chart.

## 2012-10-03 ENCOUNTER — Ambulatory Visit (INDEPENDENT_AMBULATORY_CARE_PROVIDER_SITE_OTHER): Payer: Managed Care, Other (non HMO) | Admitting: Family Medicine

## 2012-10-03 ENCOUNTER — Encounter: Payer: Self-pay | Admitting: Family Medicine

## 2012-10-03 VITALS — BP 100/80 | HR 91 | Temp 98.3°F | Ht 65.75 in | Wt 150.0 lb

## 2012-10-03 DIAGNOSIS — J020 Streptococcal pharyngitis: Secondary | ICD-10-CM

## 2012-10-03 MED ORDER — PREDNISONE 20 MG PO TABS: ORAL_TABLET | ORAL | Status: DC

## 2012-10-03 MED ORDER — AMOXICILLIN 500 MG PO CAPS: 500.0000 mg | ORAL_CAPSULE | Freq: Two times a day (BID) | ORAL | Status: DC

## 2012-10-03 NOTE — Progress Notes (Signed)
  Subjective:    Patient ID: Tasha Hanna, female    DOB: 04/08/1991, 21 y.o.   MRN: [id number]  HPI Sore throat- sxs started Friday and 'it just keeps getting worse'.  Went to [facility] yesterday (Tuesday) and had negative strep test, awaiting culture results.  Throat is much worse this AM.  No relief w/ ibuprofen, tylenol.  No fever, mild nasal congestion, dry cough.  + sick contacts.   Review of Systems For ROS see HPI     Objective:   Physical Exam  Vitals reviewed. Constitutional: She appears well-developed and well-nourished. No distress.  HENT:  Head: Normocephalic and atraumatic.  Nose: Nose normal.  TMs normal bilaterally No TTP over sinuses Bilateral tonsillar enlargement w/ erythema and exudate  Neck: Normal range of motion. Neck supple.  Cardiovascular: Normal rate, regular rhythm and normal heart sounds.   Pulmonary/Chest: Effort normal and breath sounds normal. No respiratory distress. She has no wheezes. She has no rales.  Lymphadenopathy:    She has cervical adenopathy.          Assessment & Plan:

## 2012-10-03 NOTE — Patient Instructions (Addendum)
This is strep- start the Amox twice daily, take w/ food Use the prednisone to decrease pain and inflammation- take w/ food FLUIDS! REST! Hang in there!

## 2012-10-07 NOTE — Assessment & Plan Note (Addendum)
New.  Pt's sxs consistent and rapid strep test +.  Start abx.  Prednisone as directed for pain and inflammation.  Pt expressed understanding and is in agreement w/ plan.

## 2012-11-13 ENCOUNTER — Telehealth: Payer: Self-pay | Admitting: Family Medicine

## 2012-11-13 NOTE — Telephone Encounter (Signed)
Ok to provide 1 yr of pills but needs to schedule CPE when able (no pap until 97)

## 2012-11-13 NOTE — Telephone Encounter (Signed)
Patient's mother called stating the patient's rx for sprintec 28 is expired. They would like a new rx sent to [facility] on [address].

## 2012-11-13 NOTE — Telephone Encounter (Signed)
Dr.Tabori please advise, no pap on file, no recent CPX although patient was recently seen acutely for sore throat on 10/03/2012

## 2012-11-14 MED ORDER — NORGESTIMATE-ETH ESTRADIOL 0.25-35 MG-MCG PO TABS: 1.0000 | ORAL_TABLET | Freq: Every day | ORAL | Status: DC

## 2012-11-14 NOTE — Telephone Encounter (Signed)
Rx faxed, please offer this patient a CPE        KP

## 2012-11-19 NOTE — Telephone Encounter (Signed)
Pt scheduled for cpe/w pap in August.

## 2013-04-19 ENCOUNTER — Encounter: Payer: Managed Care, Other (non HMO) | Admitting: Family Medicine

## 2013-06-12 ENCOUNTER — Encounter: Payer: Managed Care, Other (non HMO) | Admitting: Family Medicine

## 2013-07-08 ENCOUNTER — Telehealth: Payer: Self-pay

## 2013-07-08 NOTE — Telephone Encounter (Signed)
Left message for call back  identifiable     

## 2013-07-09 ENCOUNTER — Encounter: Payer: Self-pay | Admitting: Family Medicine

## 2013-07-09 ENCOUNTER — Other Ambulatory Visit (HOSPITAL_COMMUNITY)
Admission: RE | Admit: 2013-07-09 | Discharge: 2013-07-09 | Disposition: A | Discharge status: Home / Self Care | Payer: Managed Care, Other (non HMO) | Source: Ambulatory Visit | Attending: Family Medicine | Admitting: Family Medicine

## 2013-07-09 ENCOUNTER — Ambulatory Visit (INDEPENDENT_AMBULATORY_CARE_PROVIDER_SITE_OTHER): Payer: Managed Care, Other (non HMO) | Admitting: Family Medicine

## 2013-07-09 VITALS — BP 120/76 | HR 78 | Temp 98.2°F | Resp 16 | Ht 66.25 in | Wt 135.1 lb

## 2013-07-09 DIAGNOSIS — Z01419 Encounter for gynecological examination (general) (routine) without abnormal findings: Secondary | ICD-10-CM

## 2013-07-09 DIAGNOSIS — Z124 Encounter for screening for malignant neoplasm of cervix: Secondary | ICD-10-CM

## 2013-07-09 DIAGNOSIS — Z113 Encounter for screening for infections with a predominantly sexual mode of transmission: Secondary | ICD-10-CM | POA: Insufficient documentation

## 2013-07-09 DIAGNOSIS — Z1151 Encounter for screening for human papillomavirus (HPV): Secondary | ICD-10-CM | POA: Insufficient documentation

## 2013-07-09 DIAGNOSIS — Z0000 Encounter for general adult medical examination without abnormal findings: Secondary | ICD-10-CM

## 2013-07-09 LAB — CBC WITH DIFFERENTIAL/PLATELET
Basophils Absolute: 0.1 10*3/uL (ref 0.0–0.1)
Eosinophils Absolute: 0.1 10*3/uL (ref 0.0–0.7)
Hemoglobin: 13.5 g/dL (ref 12.0–15.0)
Lymphocytes Relative: 24.9 % (ref 12.0–46.0)
MCHC: 33.6 g/dL (ref 30.0–36.0)
Neutro Abs: 6.1 10*3/uL (ref 1.4–7.7)
Platelets: 264 10*3/uL (ref 150.0–400.0)
RDW: 12.9 % (ref 11.5–14.6)

## 2013-07-09 LAB — HEPATIC FUNCTION PANEL
ALT: 10 U/L (ref 0–35)
Alkaline Phosphatase: 35 U/L — ABNORMAL LOW (ref 39–117)
Bilirubin, Direct: 0.2 mg/dL (ref 0.0–0.3)
Total Bilirubin: 0.9 mg/dL (ref 0.3–1.2)

## 2013-07-09 LAB — BASIC METABOLIC PANEL
Calcium: 9.6 mg/dL (ref 8.4–10.5)
Chloride: 103 mEq/L (ref 96–112)
Creatinine, Ser: 0.7 mg/dL (ref 0.4–1.2)
Sodium: 136 mEq/L (ref 135–145)

## 2013-07-09 LAB — LIPID PANEL
Cholesterol: 172 mg/dL (ref 0–200)
LDL Cholesterol: 80 mg/dL (ref 0–99)
Triglycerides: 111 mg/dL (ref 0.0–149.0)
VLDL: 22.2 mg/dL (ref 0.0–40.0)

## 2013-07-09 NOTE — Assessment & Plan Note (Signed)
Pap collected. 

## 2013-07-09 NOTE — Progress Notes (Signed)
Pre visit review using our clinic review tool, if applicable. No additional management support is needed unless otherwise documented below in the visit note. 

## 2013-07-09 NOTE — Assessment & Plan Note (Signed)
Pt's PE WNL.  Breast and pap exam done today.  Check labs.  Anticipatory guidance provided.

## 2013-07-09 NOTE — Progress Notes (Signed)
  Subjective:    Patient ID: Tasha Hanna, female    DOB: 1990-10-01, 22 y.o.   MRN: [id number]  HPI Pre visit review using our clinic review tool, if applicable. No additional management support is needed unless otherwise documented below in the visit note.   CPE- no concerns.   Review of Systems Patient reports no vision/ hearing changes, adenopathy,fever, weight change,  persistant/recurrent hoarseness , swallowing issues, chest pain, palpitations, edema, persistant/recurrent cough, hemoptysis, dyspnea (rest/exertional/paroxysmal nocturnal), gastrointestinal bleeding (melena, rectal bleeding), abdominal pain, significant heartburn, bowel changes, GU symptoms (dysuria, hematuria, incontinence), Gyn symptoms (abnormal  bleeding, pain),  syncope, focal weakness, memory loss, numbness & tingling, skin/hair/nail changes, abnormal bruising or bleeding, anxiety, or depression.     Objective:   Physical Exam  General Appearance:    Alert, cooperative, no distress, appears stated age  Head:    Normocephalic, without obvious abnormality, atraumatic  Eyes:    PERRL, conjunctiva/corneas clear, EOM's intact, fundi    benign, both eyes  Ears:    Normal TM's and external ear canals, both ears  Nose:   Nares normal, septum midline, mucosa normal, no drainage    or sinus tenderness  Throat:   Lips, mucosa, and tongue normal; teeth and gums normal  Neck:   Supple, symmetrical, trachea midline, no adenopathy;    Thyroid: no enlargement/tenderness/nodules  Back:     Symmetric, no curvature, ROM normal, no CVA tenderness  Lungs:     Clear to auscultation bilaterally, respirations unlabored  Chest Wall:    No tenderness or deformity   Heart:    Regular rate and rhythm, S1 and S2 normal, no murmur, rub   or gallop  Breast Exam:    No tenderness, masses, or nipple abnormality  Abdomen:     Soft, non-tender, bowel sounds active all four quadrants,    no masses, no organomegaly  Genitalia:    External  genitalia normal, cervix normal in appearance, no CMT, uterus in normal size and position, adnexa w/out mass or tenderness, mucosa pink and moist, no lesions or discharge present  Rectal:    Normal external appearance  Extremities:   Extremities normal, atraumatic, no cyanosis or edema  Pulses:   2+ and symmetric all extremities  Skin:   Skin color, texture, turgor normal, no rashes or lesions  Lymph nodes:   Cervical, supraclavicular, and axillary nodes normal  Neurologic:   CNII-XII intact, normal strength, sensation and reflexes    throughout          Assessment & Plan:

## 2013-07-09 NOTE — Patient Instructions (Signed)
Follow up in 1 year or as needed We'll notify you of your lab results and make any changes if needed Keep up the good work! Call with any questions or concerns Happy Holidays!

## 2013-07-09 NOTE — Telephone Encounter (Signed)
Unable to reach pre visit.  

## 2013-07-10 ENCOUNTER — Encounter: Payer: Self-pay | Admitting: General Practice

## 2013-08-30 ENCOUNTER — Encounter: Payer: Self-pay | Admitting: Family Medicine

## 2013-08-30 ENCOUNTER — Ambulatory Visit (INDEPENDENT_AMBULATORY_CARE_PROVIDER_SITE_OTHER): Payer: Managed Care, Other (non HMO) | Admitting: Family Medicine

## 2013-08-30 VITALS — BP 128/80 | HR 96 | Temp 98.7°F | Wt 140.0 lb

## 2013-08-30 DIAGNOSIS — R059 Cough, unspecified: Secondary | ICD-10-CM

## 2013-08-30 DIAGNOSIS — J029 Acute pharyngitis, unspecified: Secondary | ICD-10-CM

## 2013-08-30 DIAGNOSIS — R05 Cough: Secondary | ICD-10-CM

## 2013-08-30 LAB — POCT RAPID STREP A (OFFICE): RAPID STREP A SCREEN: NEGATIVE

## 2013-08-30 MED ORDER — PENICILLIN G BENZATHINE 1200000 UNIT/2ML IM SUSP
1.2000 10*6.[IU] | Freq: Once | INTRAMUSCULAR | Status: AC
  Administered 2013-08-30: 1.2 10*6.[IU] via INTRAMUSCULAR

## 2013-08-30 MED ORDER — GUAIFENESIN-CODEINE 100-10 MG/5ML PO SYRP: ORAL_SOLUTION | ORAL | Status: DC

## 2013-08-30 NOTE — Patient Instructions (Signed)

## 2013-08-30 NOTE — Progress Notes (Signed)
  Subjective:     Tasha Hanna is a 23 y.o. female who presents for evaluation of sore throat. Associated symptoms include low grade fevers, chills, nasal blockage, night sweats, pain while swallowing, productive cough, sinus and nasal congestion, sore throat, swollen glands and white spots in throat. Onset of symptoms was 3 days ago, and have been gradually worsening since that time. She is drinking plenty of fluids. She has not had a recent close exposure to someone with proven streptococcal pharyngitis.  The following portions of the patient's history were reviewed and updated as appropriate: allergies, current medications, past family history, past medical history, past social history, past surgical history and problem list.  Review of Systems Pertinent items are noted in HPI.    Objective:    BP 128/80  Pulse 96  Temp(Src) 98.7 F (37.1 C) (Oral)  Wt 140 lb (63.504 kg)  SpO2 98% General appearance: alert, cooperative, appears stated age and no distress Ears: normal TM's and external ear canals both ears Nose: green discharge, moderate congestion, turbinates red, swollen, sinus tenderness bilateral Throat: abnormal findings: exudates present and moderate oropharyngeal erythema Neck: no adenopathy, supple, symmetrical, trachea midline and thyroid not enlarged, symmetric, no tenderness/mass/nodules Lungs: clear to auscultation bilaterally Heart: S1, S2 normal  Laboratory Strep test done. Results:negative.    Assessment:    Acute pharyngitis, likely   Bacterial tonsillitis R/O strep.    Plan:    Use of OTC analgesics recommended as well as salt water gargles. Follow up as needed. bicillin 1.2 mu IM  Throat culture done secondary to neg quick strep  Cough med per orders

## 2013-08-30 NOTE — Progress Notes (Signed)
Pre visit review using our clinic review tool, if applicable. No additional management support is needed unless otherwise documented below in the visit note. 

## 2013-09-01 LAB — CULTURE, GROUP A STREP: ORGANISM ID, BACTERIA: NORMAL

## 2013-09-03 ENCOUNTER — Encounter: Payer: Self-pay | Admitting: Family

## 2013-09-03 ENCOUNTER — Ambulatory Visit (INDEPENDENT_AMBULATORY_CARE_PROVIDER_SITE_OTHER): Payer: Managed Care, Other (non HMO) | Admitting: Family

## 2013-09-03 VITALS — BP 120/84 | HR 93 | Temp 98.6°F | Resp 16 | Ht 66.25 in | Wt 143.0 lb

## 2013-09-03 DIAGNOSIS — H6690 Otitis media, unspecified, unspecified ear: Secondary | ICD-10-CM

## 2013-09-03 MED ORDER — AMOXICILLIN 500 MG PO TABS: 500.0000 mg | ORAL_TABLET | Freq: Three times a day (TID) | ORAL | Status: DC

## 2013-09-03 NOTE — Progress Notes (Signed)
Pre visit review using our clinic review tool, if applicable. No additional management support is needed unless otherwise documented below in the visit note. 

## 2013-09-03 NOTE — Assessment & Plan Note (Signed)
23 yr old female with right sided OM. Rx with amoxicillin. Advised pt to use back up form of birth control for remainder of pill pack due to potential interaction between OCP and abx.

## 2013-09-03 NOTE — Progress Notes (Signed)
   Subjective:    Patient ID: Tasha Hanna, female    DOB: [birth year], 23 y.o.   MRN: [id number]  HPI  Tasha Hanna is a 23 yr old female who presents today with chief complaint of ear pain. Reports pain is located in the right ear and is associated with hearing loss. Denies recent fever.  + nasal congestion.   She was seen last week by Dr. Laury AxonLowne for pharyngitis and rapid strep/culture were negative.  Flu testing was not performed at that visit.  She reports that she had flu symptoms earlier that week but did not see anyone, just stayed at home.  Review of Systems    see HPI  Past Medical History  Diagnosis Date  . Acne     Dr Sherryl Bartersafeen's NP    History   Social History  . Marital Status: Single    Spouse Name: N/A    Number of Children: N/A  . Years of Education: N/A   Occupational History  . Not on file.   Social History Main Topics  . Smoking status: Never Smoker   . Smokeless tobacco: Not on file  . Alcohol Use: Yes  . Drug Use: No  . Sexual Activity: No   Other Topics Concern  . Not on file   Social History Narrative   Lives at home with mom, dad, and brother.    Past Surgical History  Procedure Laterality Date  . Wisdom tooth extraction      Family History  Problem Relation Age of Onset  . Hypertension Father   . Diabetes Maternal Grandfather   . Heart attack Maternal Grandfather   . Stroke Maternal Grandfather   . Cancer Maternal Grandfather     Bladder  . Prostate cancer Maternal Grandfather   . Diabetes Paternal Grandfather     No Known Allergies  Current Outpatient Prescriptions on File Prior to Visit  Medication Sig Dispense Refill  . guaiFENesin-codeine (CHERATUSSIN AC) 100-10 MG/5ML syrup 1 -2 tsp po qhs prn cough  120 mL  0  . norgestimate-ethinyl estradiol (SPRINTEC 28) 0.25-35 MG-MCG tablet Take 1 tablet by mouth daily. Please provide 6 months b/c [place]  1 Package  11   No current facility-administered medications on  file prior to visit.    BP 120/84  Pulse 93  Temp(Src) 98.6 F (37 C) (Oral)  Resp 16  Ht 5' 6.25" (1.683 m)  Wt 143 lb 0.6 oz (64.883 kg)  BMI 22.91 kg/m2  SpO2 99%    Objective:   Physical Exam  Constitutional: She is oriented to person, place, and time. She appears well-developed and well-nourished. No distress.  HENT:  Head: Normocephalic and atraumatic.  Right Ear: Ear canal normal.  Left Ear: Tympanic membrane and ear canal normal.  R TM with purulent effusion, no erythema is noted.  Neck:  R sided cervical LAD noted   Musculoskeletal: She exhibits no edema.  Neurological: She is alert and oriented to person, place, and time.  Psychiatric: She has a normal mood and affect. Her behavior is normal. Judgment and thought content normal.          Assessment & Plan:

## 2013-09-03 NOTE — Patient Instructions (Signed)
Otitis Media, Adult Otitis media is redness, soreness, and swelling (inflammation) of the middle ear. Otitis media may be caused by allergies or, most commonly, by infection. Often it occurs as a complication of the common cold. SIGNS AND SYMPTOMS Symptoms of otitis media may include:  Earache.  Fever.  Ringing in your ear.  Headache.  Leakage of fluid from the ear. DIAGNOSIS To diagnose otitis media, your health care provider will examine your ear with an otoscope. This is an instrument that allows your health care provider to see into your ear in order to examine your eardrum. Your health care provider also will ask you questions about your symptoms. TREATMENT  Typically, otitis media resolves on its own within 3 5 days. Your health care provider may prescribe medicine to ease your symptoms of pain. If otitis media does not resolve within 5 days or is recurrent, your health care provider may prescribe antibiotic medicines if he or she suspects that a bacterial infection is the cause. HOME CARE INSTRUCTIONS   Take your medicine as directed until it is gone, even if you feel better after the first few days.  Only take over-the-counter or prescription medicines for pain, discomfort, or fever as directed by your health care provider.  Follow up with your health care provider as directed. SEEK MEDICAL CARE IF:  You have otitis media only in one ear or bleeding from your nose or both.  You notice a lump on your neck.  You are not getting better in 3 5 days.  You feel worse instead of better. SEEK IMMEDIATE MEDICAL CARE IF:   You have pain that is not controlled with medicine.  You have swelling, redness, or pain around your ear or stiffness in your neck.  You notice that part of your face is paralyzed.  You notice that the bone behind your ear (mastoid) is tender when you touch it. MAKE SURE YOU:   Understand these instructions.  Will watch your condition.  Will get help  right away if you are not doing well or get worse. Document Released: 05/13/2004 Document Revised: 05/29/2013 Document Reviewed: 03/05/2013 [employer] Patient Information 2014 [employer].  

## 2013-09-04 ENCOUNTER — Telehealth: Payer: Self-pay | Admitting: *Deleted

## 2013-09-04 ENCOUNTER — Telehealth: Payer: Self-pay | Admitting: Family

## 2013-09-04 NOTE — Telephone Encounter (Signed)
Pt is still having pain in her ear with yellowish drainage, is this normal? Please advise

## 2013-09-04 NOTE — Telephone Encounter (Signed)
Yes. This can be normal.  Let us know if symptoms worsen or if not resolved by the time she completes abx. She can use tylenol or motrin as needed for pain.

## 2013-09-04 NOTE — Telephone Encounter (Signed)
Notified pt and she voices understanding. 

## 2013-09-04 NOTE — Telephone Encounter (Signed)
Patient mother called and stated that her daughter was seen yesterday at [facility] and her ear still hurts. Also yellow drainage is coming out of her ear. I informed mother that dr Beverely Lowtabori did not have any available appointments today but maybe calling over to [facility] to see if they can prescribe her something else. Patient mother still insisted that dr Beverely Lowtabori call her.

## 2013-10-02 ENCOUNTER — Other Ambulatory Visit: Payer: Self-pay | Admitting: Family Medicine

## 2013-10-03 NOTE — Telephone Encounter (Signed)
Med filled.  

## 2013-12-26 ENCOUNTER — Other Ambulatory Visit: Payer: Self-pay | Admitting: Family Medicine

## 2013-12-26 NOTE — Telephone Encounter (Signed)
Med filled.  

## 2014-03-17 ENCOUNTER — Other Ambulatory Visit: Payer: Self-pay | Admitting: Family Medicine

## 2014-03-17 NOTE — Telephone Encounter (Signed)
Med filled.  

## 2014-04-02 ENCOUNTER — Encounter: Payer: Self-pay | Admitting: Medical

## 2014-04-02 ENCOUNTER — Ambulatory Visit (INDEPENDENT_AMBULATORY_CARE_PROVIDER_SITE_OTHER): Payer: Managed Care, Other (non HMO) | Admitting: Medical

## 2014-04-02 VITALS — BP 114/78 | HR 80 | Temp 98.2°F | Wt 143.0 lb

## 2014-04-02 DIAGNOSIS — J029 Acute pharyngitis, unspecified: Secondary | ICD-10-CM

## 2014-04-02 DIAGNOSIS — R591 Generalized enlarged lymph nodes: Secondary | ICD-10-CM | POA: Insufficient documentation

## 2014-04-02 DIAGNOSIS — R599 Enlarged lymph nodes, unspecified: Secondary | ICD-10-CM

## 2014-04-02 LAB — POCT MONO (EPSTEIN BARR VIRUS): Mono, POC: NEGATIVE

## 2014-04-02 MED ORDER — AZITHROMYCIN 250 MG PO TABS: ORAL_TABLET | ORAL | Status: DC

## 2014-04-02 MED ORDER — FLUCONAZOLE 150 MG PO TABS: ORAL_TABLET | ORAL | Status: DC

## 2014-04-02 NOTE — Progress Notes (Signed)
Pre visit review using our clinic review tool, if applicable. No additional management support is needed unless otherwise documented below in the visit note. 

## 2014-04-02 NOTE — Assessment & Plan Note (Signed)
Pt has moderate enlarged and tender submandibular lymph nodes. By exam throat looks moderately suspicious. Though not clear if that is potential source of node enlargement as she is asymptomatic in throat and has no ear pain, no sinus pain and not teeth pain. Based on size of nodes decided to rapid strep, send out culture and monospot in office. Will treat empirically with azithromycin. If all test negative and symptoms persist then advised pt would get cbc.

## 2014-04-02 NOTE — Assessment & Plan Note (Signed)
Will assess response of pt to azithromycin. If nodes still tender then get cbc. Also advised pt to update us on partoid gland region pain. Advise promote secretion with lozenge or hard candy. If these area persist or worsen would consider referral to ent.

## 2014-04-02 NOTE — Patient Instructions (Signed)
Your rapid strep test was negative and I am sending out throat culture as well. You monospot test was also negative. I am going to give you azithromycin prescription for possible early throat infection pending send out culture. If all test negative and your symptoms in lymph nodes area persist or your mandible regions still hurt would get further labs(cbc). Follow up in 7-10 days any persisiting symptoms or as needed.

## 2014-04-02 NOTE — Progress Notes (Signed)
Subjective:    Patient ID: Tasha Hanna, female    DOB: Sep 24, 1990, 23 y.o.   MRN: [id number]  HPI   Pt in with reported swelling of her neck. She points to submandibular node regions. Neck is sore around submandibular nodes when moves neck. Pt feels mild tired but she attributes to working out. Work out 5 days a week. About 1 hour each work out. No sore throat. No family or friends sick. LMP- 3 wks ago. No sinus pain, no ear pain.  Past Medical History  Diagnosis Date  . Acne     Dr Sherryl Barters NP    History   Social History  . Marital Status: Single    Spouse Name: N/A    Number of Children: N/A  . Years of Education: N/A   Occupational History  . Not on file.   Social History Main Topics  . Smoking status: Never Smoker   . Smokeless tobacco: Not on file  . Alcohol Use: Yes  . Drug Use: No  . Sexual Activity: No   Other Topics Concern  . Not on file   Social History Narrative   Lives at home with mom, dad, and brother.    Past Surgical History  Procedure Laterality Date  . Wisdom tooth extraction      Family History  Problem Relation Age of Onset  . Hypertension Father   . Diabetes Maternal Grandfather   . Heart attack Maternal Grandfather   . Stroke Maternal Grandfather   . Cancer Maternal Grandfather     Bladder  . Prostate cancer Maternal Grandfather   . Diabetes Paternal Grandfather     No Known Allergies  Current Outpatient Prescriptions on File Prior to Visit  Medication Sig Dispense Refill  . MONONESSA 0.25-35 MG-MCG tablet TAKE 1 TABLET BY MOUTH EVERY DAY  28 tablet  3  . amoxicillin (AMOXIL) 500 MG tablet Take 1 tablet (500 mg total) by mouth 3 (three) times daily.  30 tablet  0  . guaiFENesin-codeine (CHERATUSSIN AC) 100-10 MG/5ML syrup 1 -2 tsp po qhs prn cough  120 mL  0   No current facility-administered medications on file prior to visit.    BP 114/78  Pulse 80  Temp(Src) 98.2 F (36.8 C)  Wt 143 lb (64.864 kg)  SpO2  99%     Review of Systems  Constitutional: Negative for fever, chills and fatigue.  HENT: Negative for congestion, drooling, ear pain, mouth sores, nosebleeds, postnasal drip, rhinorrhea, sinus pressure and sore throat.        Bilateral mandible pain. Point over parotid glands.  Respiratory: Negative for choking, chest tightness and wheezing.   Cardiovascular: Negative for chest pain and palpitations.  Gastrointestinal: Negative for nausea, vomiting, abdominal pain, diarrhea, constipation and rectal pain.  Endocrine: Negative for polydipsia, polyphagia and polyuria.  Musculoskeletal: Negative for arthralgias, back pain, joint swelling, myalgias, neck pain and neck stiffness.       Pain in submandibular nodes on rom. But otherwise no pain on rom.  Skin: Negative for color change, pallor and rash.  Neurological: Negative for dizziness, syncope, speech difficulty, weakness, light-headedness and headaches.  Hematological: Positive for adenopathy. Does not bruise/bleed easily.       Submandibular nodes swollen and tender.       Objective:   Physical Exam  Constitutional: She is oriented to person, place, and time. She appears well-developed and well-nourished. No distress.  HENT:  Head: Normocephalic and atraumatic.  Right Ear: External ear normal.  Left Ear: External ear normal.  Nose: Nose normal.  Mouth/Throat: No oropharyngeal exudate.  Posterior pharynx moderate bright [email or web address].   Also both parotid gland areas are moderate tender to palpation but not swollen.  Neck: Normal range of motion. Neck supple. No JVD present. No tracheal deviation present. No thyromegaly present.  Moderate enlarged tender submandibular nodes. When rotates neck has pain in this region but otherwise no neck pain.  Cardiovascular: Normal rate and regular rhythm.   Pulmonary/Chest: Effort normal and breath sounds normal. No stridor. No respiratory distress. She has no wheezes. She has no rales.  She exhibits no tenderness.  Abdominal: Soft. Bowel sounds are normal. She exhibits no distension and no mass. There is no tenderness. There is no rebound and no guarding.  No splenomegaly. No luq tenderness.  Lymphadenopathy:    She has cervical adenopathy.  Neurological: She is alert and oriented to person, place, and time. No cranial nerve deficit. Coordination normal.  Skin: She is not diaphoretic.  Psychiatric: She has a normal mood and affect. Her behavior is normal. Judgment and thought content normal.          Assessment & Plan:

## 2014-04-03 ENCOUNTER — Encounter: Payer: Self-pay | Admitting: Physician Assistant

## 2014-04-03 ENCOUNTER — Ambulatory Visit (INDEPENDENT_AMBULATORY_CARE_PROVIDER_SITE_OTHER): Payer: Managed Care, Other (non HMO) | Admitting: Physician Assistant

## 2014-04-03 ENCOUNTER — Telehealth: Payer: Self-pay | Admitting: Family Medicine

## 2014-04-03 VITALS — BP 116/76 | HR 72 | Temp 98.2°F | Resp 16 | Ht 66.25 in | Wt 144.0 lb

## 2014-04-03 DIAGNOSIS — K1120 Sialoadenitis, unspecified: Secondary | ICD-10-CM | POA: Insufficient documentation

## 2014-04-03 MED ORDER — CEFTRIAXONE SODIUM 500 MG IJ SOLR
500.0000 mg | Freq: Once | INTRAMUSCULAR | Status: AC
  Administered 2014-04-03: 500 mg via INTRAMUSCULAR

## 2014-04-03 MED ORDER — AMOXICILLIN-POT CLAVULANATE 875-125 MG PO TABS: 1.0000 | ORAL_TABLET | Freq: Two times a day (BID) | ORAL | Status: DC

## 2014-04-03 MED ORDER — METHYLPREDNISOLONE (PAK) 4 MG PO TABS: ORAL_TABLET | ORAL | Status: DC

## 2014-04-03 MED ORDER — METHYLPREDNISOLONE ACETATE 40 MG/ML IJ SUSP
40.0000 mg | Freq: Once | INTRAMUSCULAR | Status: AC
  Administered 2014-04-03: 40 mg via INTRAMUSCULAR

## 2014-04-03 NOTE — Patient Instructions (Signed)
I believe you have a bacterial infection of multiple salivary glands. Please start steroid pack tomorrow, taking as directed.  STOP Azithromycin.  Start taking Augmentin twice daily with food.  Suck on some sour candy to promote salivation.  If symptoms acutely worsen over this evening, I want you to go directly to the ER.    Candise BowensJen will come into the room and give you instructions on your appointment with the ENT doctor.  I have given you in office Cefdinir 500 mg IM and Depo Medrol 40 mg IM.  You have a prescription for a Medrol Dose Pack and for Augmentin 875-125 BID x 10 days.  Show this to the ENT doctor.

## 2014-04-03 NOTE — Addendum Note (Signed)
Addended by: Regis BillSCATES, Makail Watling L on: 04/03/2014 03:11 PM   Modules accepted: Orders

## 2014-04-03 NOTE — Progress Notes (Signed)
Patient presents to clinic today c/o continued swelling of mandibular region with pain and tenderness.  Patient was seen yesterday and diagnosed with lymphadenopathy of neck.  Rapid strep and Monospot testing were negative.  Rx for Azithromycin was given by examining provider. Patient notes significant increase in the swelling overnight despite antibiotic.  States it is very hard to open her mouth.  Denies ST, fever, chills, cough or other URI symptom.  Denies dyspnea. States her mouth feels really dry.  Past Medical History  Diagnosis Date  . Acne     Dr Sherryl Bartersafeen's NP    Current Outpatient Prescriptions on File Prior to Visit  Medication Sig Dispense Refill  . fluconazole (DIFLUCAN) 150 MG tablet 1 tab po if yeast infection with antibiotic use.  1 tablet  0  . MONONESSA 0.25-35 MG-MCG tablet TAKE 1 TABLET BY MOUTH EVERY DAY  28 tablet  3   No current facility-administered medications on file prior to visit.    No Known Allergies  Family History  Problem Relation Age of Onset  . Hypertension Father   . Diabetes Maternal Grandfather   . Heart attack Maternal Grandfather   . Stroke Maternal Grandfather   . Cancer Maternal Grandfather     Bladder  . Prostate cancer Maternal Grandfather   . Diabetes Paternal Grandfather     History   Social History  . Marital Status: Single    Spouse Name: N/A    Number of Children: N/A  . Years of Education: N/A   Social History Main Topics  . Smoking status: Never Smoker   . Smokeless tobacco: Not on file  . Alcohol Use: Yes  . Drug Use: No  . Sexual Activity: No   Other Topics Concern  . Not on file   Social History Narrative   Lives at home with mom, dad, and brother.   Review of Systems - See HPI.  All other ROS are negative.  BP 116/76  Pulse 72  Temp(Src) 98.2 F (36.8 C) (Oral)  Resp 16  Ht 5' 6.25" (1.683 m)  Wt 144 lb (65.318 kg)  BMI 23.06 kg/m2  SpO2 96%  LMP 03/13/2014  Physical Exam  Vitals  reviewed. Constitutional: She is oriented to person, place, and time and well-developed, well-nourished, and in no distress.  HENT:  Head: Normocephalic and atraumatic.  Right Ear: Tympanic membrane, external ear and ear canal normal.  Left Ear: Tympanic membrane, external ear and ear canal normal.  Nose: Nose normal.  Mouth/Throat: Uvula is midline and mucous membranes are normal. No oral lesions. No dental abscesses or uvula swelling. No oropharyngeal exudate, posterior oropharyngeal edema, posterior oropharyngeal erythema or tonsillar abscesses.  Submandibular region with significant swelling and tenderness with some small but tender palpable lymph nodes. No lymphadenopathy elsewhere.  Salivary glands with little saliva production despite milking.  No noticeable stone in submandibular salivary gland ducts.   Eyes: Conjunctivae are normal.  Neck: Trachea normal. Neck supple. No mass and no thyromegaly present.  Cardiovascular: Normal rate, regular rhythm, normal heart sounds and intact distal pulses.   Pulmonary/Chest: Effort normal and breath sounds normal. No respiratory distress. She has no wheezes. She has no rales. She exhibits no tenderness.  Neurological: She is alert and oriented to person, place, and time.  Skin: Skin is warm and dry. No rash noted.  Psychiatric: Affect normal.    Recent Results (from the past 2160 hour(s))  POCT MONO (EPSTEIN BARR VIRUS)     Status: None  Collection Time    04/02/14  3:41 PM      Result Value Ref Range   Mono, POC Negative  Negative    Assessment/Plan: Sialadenitis Concern for bacterial Sialadenitis.  IM Cefdinir and Depo Medrol given by nursing.  STOP Azithromycin.  Begin Augmentin BID with food.  Stay well hydrated. Suck on sour candy to promote salivation.  Due to rapidly worsened symptoms, patient scheduled with ENT today at 1 for further evaluation and treatment.

## 2014-04-03 NOTE — Telephone Encounter (Signed)
Patient is on schedule with Selena Battenody, PA-C for today.

## 2014-04-03 NOTE — Progress Notes (Signed)
Pre visit review using our clinic review tool, if applicable. No additional management support is needed unless otherwise documented below in the visit note/SLS  

## 2014-04-03 NOTE — Assessment & Plan Note (Signed)
Concern for bacterial Sialadenitis.  IM Cefdinir and Depo Medrol given by nursing.  STOP Azithromycin.  Begin Augmentin BID with food.  Stay well hydrated. Suck on sour candy to promote salivation.  Due to rapidly worsened symptoms, patient scheduled with ENT today at 1 for further evaluation and treatment.

## 2014-04-03 NOTE — Telephone Encounter (Signed)
Patient Information:  Caller Name: Tasha Hanna Office/ Mother-Tasha Hanna  Phone: [phone]  Patient: Tasha Hanna, Tasha Hanna  Gender: Female  DOB: 02-13-1991  Age: 23 Years  PCP: Sheliah Hatchabori, Katherine E.  Pregnant: No  Office Follow Up:  Does the office need to follow up with this patient?: Yes  Instructions For The Office: Appt schedule booked.  Scheduled at [facility] [place] at 10:00 with PA-C Daphine DeutscherMartin.  Swelling increased since yesterday 04/02/14.  Please review.  RN Note:  Advil given for discomfort. Ice in place.  PCP schedule is full. appt scheduled at [facility] [place] with PA-Martin at 10:00.  Care advice and call back parameters reviewed with mother. Understanding expressed. Encouraged to closely mointor. Understanding expressed.  Symptoms  Reason For Call & Symptoms: Mother calling concerning her daughter Tasha Hanna.  Seen in office yesterday 04/02/14 by PA Saguier for neck /jaw swelling and pain. The Jaw was sore on Saturday but has progressively worsened.   Mother states that today the swelling is twice more swollen. "Golf ball size".   She has 3 dose Azithromycin. Afebrile.  She is able to swallow but cannot open mouth wide. No changes in breathing.  Reviewed Health History In EMR: Yes  Reviewed Medications In EMR: Yes  Reviewed Allergies In EMR: Yes  Reviewed Surgeries / Procedures: Yes  Date of Onset of Symptoms: 03/29/2014  Treatments Tried: Azithromycin and ice  Treatments Tried Worked: No OB / GYN:  LMP: 03/13/2014  Guideline(s) Used:  Neck Pain or Stiffness  Lymph Nodes - Swollen  Disposition Per Guideline:   Go to Office Now  Reason For Disposition Reached:   Single large node and size > 1 inch (2.5 cm)  Advice Given:  Pain and Fever Medicines:  For pain or fever relief, take either acetaminophen or ibuprofen.  They are over-the-counter (OTC) drugs that help treat both fever and pain. You can buy them at the drugstore.  Ibuprofen (e.g., Motrin, Advil):  Take 400 mg (two 200 mg  pills) by mouth every 6 hours.  Another choice is to take 600 mg (three 200 mg pills) by mouth every 8 hours.  Call Back If:  Node enlarges to more than 1 inch (2.5 cm) in size  Overlying skin becomes red  Fever more than 103 F (39.4 C) occurs  You become worse or are worried about a lymph node.  Patient Will Follow Care Advice:  YES  Appointment Scheduled:  04/03/2014 10:00:00 Appointment Scheduled Provider:  Other

## 2014-04-04 LAB — CULTURE, GROUP A STREP: Organism ID, Bacteria: NORMAL

## 2014-06-20 ENCOUNTER — Telehealth: Payer: Self-pay | Admitting: Family Medicine

## 2014-06-20 ENCOUNTER — Other Ambulatory Visit: Payer: Self-pay | Admitting: General Practice

## 2014-06-20 MED ORDER — NORGESTIMATE-ETH ESTRADIOL 0.25-35 MG-MCG PO TABS: ORAL_TABLET | ORAL | Status: DC

## 2014-06-20 NOTE — Telephone Encounter (Signed)
Caller name:  [name] Relation to pt: Call back number: [facility] [address]  Reason for call:  Requesting refill on Rx methylPREDNIsolone (MEDROL DOSPACK) 4 MG tablet

## 2014-06-20 NOTE — Telephone Encounter (Signed)
Spoke with pt. She wanted her BC filled not the prednisone. This medication was sent to pharmacy as requested.

## 2014-06-20 NOTE — Telephone Encounter (Signed)
This is an acute medication and doesn't get refilled w/o OV or reason

## 2014-09-09 ENCOUNTER — Other Ambulatory Visit: Payer: Self-pay | Admitting: Family Medicine

## 2014-09-10 NOTE — Telephone Encounter (Signed)
Last OV 07-09-13 monessa last filled 05-2014 #28 with 3  No upcoming appts

## 2014-09-10 NOTE — Telephone Encounter (Signed)
Med filled and letter mailed to pt to schedule appt.  

## 2014-09-10 NOTE — Telephone Encounter (Signed)
Ok for 3 months and needs to schedule OV- if no OV, no additional meds can be prescribed

## 2014-10-07 ENCOUNTER — Ambulatory Visit (INDEPENDENT_AMBULATORY_CARE_PROVIDER_SITE_OTHER): Payer: Managed Care, Other (non HMO) | Admitting: Family Medicine

## 2014-10-07 ENCOUNTER — Encounter: Payer: Self-pay | Admitting: Family Medicine

## 2014-10-07 VITALS — BP 124/80 | HR 95 | Temp 98.2°F | Resp 16 | Wt 143.2 lb

## 2014-10-07 DIAGNOSIS — F40243 Fear of flying: Secondary | ICD-10-CM

## 2014-10-07 MED ORDER — ALPRAZOLAM 0.25 MG PO TABS: 0.2500 mg | ORAL_TABLET | Freq: Three times a day (TID) | ORAL | Status: DC | PRN

## 2014-10-07 NOTE — Assessment & Plan Note (Signed)
New to provider, recurrent problem for pt.  She is worried about getting on the flight due to anxiety and then not being able to sleep due to anxiety while on long flight to [place].  Will start low dose alprazolam for as needed use.  Reviewed supportive care and red flags that should prompt return.  Pt expressed understanding and is in agreement w/ plan.

## 2014-10-07 NOTE — Progress Notes (Signed)
   Subjective:    Patient ID: Tasha Hanna, female    DOB: 29-Jul-1991, 24 y.o.   MRN: [id number]  HPI Flight anxiety- going to [place] on 3/4.  Is 'very nervous' about flying. Has flown before and 'got sick on the way to the airport'.  Is not flying alone.  Has never taken anxiety meds previously.    Review of Systems For ROS see HPI     Objective:   Physical Exam  Constitutional: She is oriented to person, place, and time. She appears well-developed and well-nourished. No distress.  HENT:  Head: Normocephalic and atraumatic.  Neurological: She is alert and oriented to person, place, and time.  Skin: Skin is warm and dry.  Psychiatric: She has a normal mood and affect. Her behavior is normal. Thought content normal.  Vitals reviewed.         Assessment & Plan:

## 2014-10-07 NOTE — Patient Instructions (Signed)
Follow up as needed Take 1 pill prior to travel to know how you will feel This is a VERY low dose.  If needed, you can take 2 and you can repeat in 4-6 hrs as needed for anxiety/sleep on the plane Do not drink alcohol with this med Call with any questions or concerns HAVE AN AMAZING TIME!!!!

## 2014-10-07 NOTE — Progress Notes (Signed)
Pre visit review using our clinic review tool, if applicable. No additional management support is needed unless otherwise documented below in the visit note. 

## 2015-01-29 ENCOUNTER — Other Ambulatory Visit: Payer: Self-pay | Admitting: Family Medicine

## 2015-01-29 NOTE — Telephone Encounter (Signed)
Med filled.  

## 2015-04-17 ENCOUNTER — Other Ambulatory Visit: Payer: Self-pay | Admitting: Family Medicine

## 2015-04-17 NOTE — Telephone Encounter (Signed)
Last OV 10/07/14 (fear of flying), No CPE since 2014 BC pills were filled 02/18/15 #28 with 0

## 2015-04-17 NOTE — Telephone Encounter (Signed)
Ok to refill- needs CPE scheduled

## 2015-04-17 NOTE — Telephone Encounter (Signed)
Medication filled to pharmacy as requested.   

## 2015-04-20 ENCOUNTER — Other Ambulatory Visit: Payer: Self-pay | Admitting: Obstetrics and Gynecology

## 2015-04-22 LAB — CYTOLOGY - PAP

## 2015-08-03 ENCOUNTER — Encounter: Payer: Self-pay | Admitting: Family Medicine

## 2015-08-03 ENCOUNTER — Ambulatory Visit (INDEPENDENT_AMBULATORY_CARE_PROVIDER_SITE_OTHER): Payer: BLUE CROSS/BLUE SHIELD | Admitting: Family Medicine

## 2015-08-03 VITALS — BP 122/84 | HR 83 | Temp 98.0°F | Resp 16 | Ht 66.0 in | Wt 135.5 lb

## 2015-08-03 DIAGNOSIS — F419 Anxiety disorder, unspecified: Secondary | ICD-10-CM

## 2015-08-03 DIAGNOSIS — F329 Major depressive disorder, single episode, unspecified: Secondary | ICD-10-CM | POA: Insufficient documentation

## 2015-08-03 DIAGNOSIS — F411 Generalized anxiety disorder: Secondary | ICD-10-CM

## 2015-08-03 DIAGNOSIS — F32A Depression, unspecified: Secondary | ICD-10-CM | POA: Insufficient documentation

## 2015-08-03 MED ORDER — SERTRALINE HCL 50 MG PO TABS: 75.0000 mg | ORAL_TABLET | Freq: Every day | ORAL | Status: DC

## 2015-08-03 NOTE — Progress Notes (Signed)
   Subjective:    Patient ID: Tasha Hanna, female    DOB: 11/21/1990, 24 y.o.   MRN: [id number]  HPI Anxiety- pt was seen at [facility] ~1 month ago for CP.  dx'd w/ anxiety and started on Sertraline.  Pain improved w/ medication.  Pt is currently on 50mg  daily but ran out yesterday.  Continues to have 'panic attacks in my sleep'- wakes up out of nightmares 'freaked out, I can't breathe'.  Occurred 2-3x in last month.  Medicine decreases chest pain to 'here and there'.  Did not have chest pain until she switched jobs.   Review of Systems For ROS see HPI     Objective:   Physical Exam  Constitutional: She is oriented to person, place, and time. She appears well-developed and well-nourished. No distress.  HENT:  Head: Normocephalic and atraumatic.  Eyes: Conjunctivae and EOM are normal. Pupils are equal, round, and reactive to light.  Neck: Normal range of motion. Neck supple. No thyromegaly present.  Cardiovascular: Normal rate, regular rhythm, normal heart sounds and intact distal pulses.   No murmur heard. Pulmonary/Chest: Effort normal and breath sounds normal. No respiratory distress.  Musculoskeletal: She exhibits no edema.  Lymphadenopathy:    She has no cervical adenopathy.  Neurological: She is alert and oriented to person, place, and time.  Skin: Skin is warm and dry.  Psychiatric: She has a normal mood and affect. Her behavior is normal.  Vitals reviewed.         Assessment & Plan:

## 2015-08-03 NOTE — Assessment & Plan Note (Signed)
New to provider, ongoing for pt.  She reports improved sxs since starting the Zoloft and does note a difference when she forgets meds.  Nightmares have decreased and chest pain is much more infrequent.  Based on the fact that she is still having some breakthrough sxs, will increase Zoloft to 75mg  daily and monitor for improvement.  Pt expressed understanding and is in agreement w/ plan.

## 2015-08-03 NOTE — Patient Instructions (Signed)
Follow up by phone or MyChart in 3-4 weeks to let me know how you are doing Increase the Zoloft to 1.5 tabs of the new prescription (75mg  daily) Continue to work on stress management- exercise, music, art, etc Call with any questions or concerns If you want to join us at the new [place] office, any scheduled appointments will automatically transfer and we will see you at [address], [place], [place] [address] (OPENING 08/25/15) Happy Holidays!!!

## 2015-08-03 NOTE — Progress Notes (Signed)
Pre visit review using our clinic review tool, if applicable. No additional management support is needed unless otherwise documented below in the visit note. 

## 2015-08-13 ENCOUNTER — Telehealth: Payer: Self-pay | Admitting: Behavioral Health

## 2015-08-13 ENCOUNTER — Encounter: Payer: Managed Care, Other (non HMO) | Admitting: Family Medicine

## 2015-08-13 ENCOUNTER — Encounter: Payer: Self-pay | Admitting: Behavioral Health

## 2015-08-13 NOTE — Telephone Encounter (Signed)
Pre-Visit Call completed with patient and chart updated.   Pre-Visit Info documented in Specialty Comments under Snap[employer]ot.    

## 2015-08-13 NOTE — Addendum Note (Signed)
Addended by: Melanee SpryBYRD, Nashonda Limberg E on: 08/13/2015 03:37 PM   Modules accepted: Medications

## 2015-08-13 NOTE — Telephone Encounter (Signed)
Unable to reach patient at time of Pre-Visit Call.  Left message for patient to return call when available.    

## 2015-08-14 ENCOUNTER — Encounter: Payer: Self-pay | Admitting: Family Medicine

## 2015-08-14 ENCOUNTER — Ambulatory Visit (INDEPENDENT_AMBULATORY_CARE_PROVIDER_SITE_OTHER): Payer: BLUE CROSS/BLUE SHIELD | Admitting: Family Medicine

## 2015-08-14 VITALS — BP 110/82 | HR 86 | Temp 98.0°F | Resp 16 | Ht 66.0 in | Wt 129.2 lb

## 2015-08-14 DIAGNOSIS — R0789 Other chest pain: Secondary | ICD-10-CM | POA: Diagnosis not present

## 2015-08-14 DIAGNOSIS — Z0000 Encounter for general adult medical examination without abnormal findings: Secondary | ICD-10-CM

## 2015-08-14 DIAGNOSIS — R079 Chest pain, unspecified: Secondary | ICD-10-CM | POA: Insufficient documentation

## 2015-08-14 LAB — CBC WITH DIFFERENTIAL/PLATELET
BASOS ABS: 0 10*3/uL (ref 0.0–0.1)
BASOS PCT: 0.4 % (ref 0.0–3.0)
EOS ABS: 0.2 10*3/uL (ref 0.0–0.7)
Eosinophils Relative: 3 % (ref 0.0–5.0)
HEMATOCRIT: 47.8 % — AB (ref 36.0–46.0)
Hemoglobin: 15.9 g/dL — ABNORMAL HIGH (ref 12.0–15.0)
LYMPHS PCT: 27.3 % (ref 12.0–46.0)
Lymphs Abs: 1.6 10*3/uL (ref 0.7–4.0)
MCHC: 33.2 g/dL (ref 30.0–36.0)
MCV: 89.5 fl (ref 78.0–100.0)
MONO ABS: 0.5 10*3/uL (ref 0.1–1.0)
Monocytes Relative: 8.7 % (ref 3.0–12.0)
NEUTROS ABS: 3.5 10*3/uL (ref 1.4–7.7)
NEUTROS PCT: 60.6 % (ref 43.0–77.0)
PLATELETS: 284 10*3/uL (ref 150.0–400.0)
RBC: 5.35 Mil/uL — ABNORMAL HIGH (ref 3.87–5.11)
RDW: 12.8 % (ref 11.5–15.5)
WBC: 5.8 10*3/uL (ref 4.0–10.5)

## 2015-08-14 LAB — BASIC METABOLIC PANEL
BUN: 10 mg/dL (ref 6–23)
CALCIUM: 10.4 mg/dL (ref 8.4–10.5)
CO2: 27 meq/L (ref 19–32)
CREATININE: 0.78 mg/dL (ref 0.40–1.20)
Chloride: 101 mEq/L (ref 96–112)
GFR: 96.99 mL/min (ref >60.00)
GLUCOSE: 80 mg/dL (ref 70–99)
Potassium: 3.9 mEq/L (ref 3.5–5.1)
Sodium: 138 mEq/L (ref 135–145)

## 2015-08-14 LAB — LIPID PANEL
CHOLESTEROL: 172 mg/dL (ref 0–200)
HDL: 76.6 mg/dL (ref >39.00)
LDL CALC: 84 mg/dL (ref 0–99)
NonHDL: 95.19
Total CHOL/HDL Ratio: 2
Triglycerides: 58 mg/dL (ref 0.0–149.0)
VLDL: 11.6 mg/dL (ref 0.0–40.0)

## 2015-08-14 LAB — HEPATIC FUNCTION PANEL
ALT: 11 U/L (ref 0–35)
AST: 15 U/L (ref 0–37)
Albumin: 4.9 g/dL (ref 3.5–5.2)
Alkaline Phosphatase: 51 U/L (ref 39–117)
BILIRUBIN DIRECT: 0.2 mg/dL (ref 0.0–0.3)
BILIRUBIN TOTAL: 1.1 mg/dL (ref 0.2–1.2)
TOTAL PROTEIN: 8.2 g/dL (ref 6.0–8.3)

## 2015-08-14 LAB — VITAMIN D 25 HYDROXY (VIT D DEFICIENCY, FRACTURES): VITD: 32.91 ng/mL (ref 30.00–100.00)

## 2015-08-14 LAB — TSH: TSH: 1.94 u[IU]/mL (ref 0.35–4.50)

## 2015-08-14 MED ORDER — RANITIDINE HCL 150 MG PO TABS: 150.0000 mg | ORAL_TABLET | Freq: Two times a day (BID) | ORAL | Status: DC

## 2015-08-14 NOTE — Patient Instructions (Signed)
Follow up in 1 year or as needed We'll notify you of your lab results and make any changes if needed Keep up the good work on healthy diet and regular exercise- you look great!!! Start the Ranitidine nightly to decrease acid production If the chest pain continues or worsens- let me know! Call with any questions or concerns If you want to join us at the new [place] office, any scheduled appointments will automatically transfer and we will see you at [address], [place], [place] [address] (OPENING JAN) Happy Holidays!!!

## 2015-08-14 NOTE — Progress Notes (Signed)
Pre visit review using our clinic review tool, if applicable. No additional management support is needed unless otherwise documented below in the visit note. 

## 2015-08-14 NOTE — Assessment & Plan Note (Signed)
Improved since starting Zoloft for anxiety but continues to have AM sxs.  Suspect this is GERD or esophageal spasm.  Start H2 blocker nightly.  Reviewed supportive care and red flags that should prompt return.  Pt expressed understanding and is in agreement w/ plan.

## 2015-08-14 NOTE — Progress Notes (Signed)
   Subjective:    Patient ID: Tasha Hanna, female    DOB: 04-Aug-1991, 24 y.o.   MRN: [id number]  HPI CPE- UTD on GYN.  No current concerns.   Review of Systems Patient reports no vision/ hearing changes, adenopathy,fever, weight change,  persistant/recurrent hoarseness , swallowing issues, palpitations, edema, persistant/recurrent cough, hemoptysis, dyspnea (rest/exertional/paroxysmal nocturnal), gastrointestinal bleeding (melena, rectal bleeding), abdominal pain, significant heartburn, bowel changes, GU symptoms (dysuria, hematuria, incontinence), Gyn symptoms (abnormal  bleeding, pain),  syncope, focal weakness, memory loss, numbness & tingling, skin/hair/nail changes, abnormal bruising or bleeding, anxiety, or depression.   + CP- occurs typically in the AM.  Less since starting Zoloft but continues to have sxs.  Described as a sharp pain, worse w/ breathing out.    Objective:   Physical Exam General Appearance:    Alert, cooperative, no distress, appears stated age  Head:    Normocephalic, without obvious abnormality, atraumatic  Eyes:    PERRL, conjunctiva/corneas clear, EOM's intact, fundi    benign, both eyes  Ears:    Normal TM's and external ear canals, both ears  Nose:   Nares normal, septum midline, mucosa normal, no drainage    or sinus tenderness  Throat:   Lips, mucosa, and tongue normal; teeth and gums normal  Neck:   Supple, symmetrical, trachea midline, no adenopathy;    Thyroid: no enlargement/tenderness/nodules  Back:     Symmetric, no curvature, ROM normal, no CVA tenderness  Lungs:     Clear to auscultation bilaterally, respirations unlabored  Chest Wall:    No tenderness or deformity   Heart:    Regular rate and rhythm, S1 and S2 normal, no murmur, rub   or gallop  Breast Exam:    Deferred to GYN  Abdomen:     Soft, non-tender, bowel sounds active all four quadrants,    no masses, no organomegaly  Genitalia:    Deferred to GYN  Rectal:    Extremities:    Extremities normal, atraumatic, no cyanosis or edema  Pulses:   2+ and symmetric all extremities  Skin:   Skin color, texture, turgor normal, no rashes or lesions  Lymph nodes:   Cervical, supraclavicular, and axillary nodes normal  Neurologic:   CNII-XII intact, normal strength, sensation and reflexes    throughout          Assessment & Plan:

## 2015-08-14 NOTE — Assessment & Plan Note (Signed)
Pt's PE WNL.  UTD on GYN.  Check labs.  Anticipatory guidance provided.  

## 2015-11-04 ENCOUNTER — Other Ambulatory Visit: Payer: Self-pay | Admitting: Family Medicine

## 2015-11-04 MED ORDER — ALPRAZOLAM 0.25 MG PO TABS: 0.2500 mg | ORAL_TABLET | Freq: Three times a day (TID) | ORAL | Status: DC | PRN

## 2015-11-04 NOTE — Telephone Encounter (Signed)
Last OV 08/14/15 Alprazolam last filled 10/07/14 #30 with 0

## 2015-11-04 NOTE — Telephone Encounter (Signed)
Medication filled to pharmacy as requested.   

## 2015-11-04 NOTE — Telephone Encounter (Signed)
Caller name: Self  Can be reached: [phone]   Reason for call: States she was given medication (? Xanax) for anxiety when flying when year and will be flying again on Friday. Request rx be refilled for this trip.

## 2015-12-10 DIAGNOSIS — M79672 Pain in left foot: Secondary | ICD-10-CM | POA: Diagnosis not present

## 2015-12-10 DIAGNOSIS — M2012 Hallux valgus (acquired), left foot: Secondary | ICD-10-CM | POA: Diagnosis not present

## 2015-12-16 DIAGNOSIS — E860 Dehydration: Secondary | ICD-10-CM | POA: Diagnosis not present

## 2015-12-16 DIAGNOSIS — R1110 Vomiting, unspecified: Secondary | ICD-10-CM | POA: Diagnosis not present

## 2015-12-16 DIAGNOSIS — R110 Nausea: Secondary | ICD-10-CM | POA: Diagnosis not present

## 2015-12-16 DIAGNOSIS — K529 Noninfective gastroenteritis and colitis, unspecified: Secondary | ICD-10-CM | POA: Diagnosis not present

## 2015-12-22 ENCOUNTER — Encounter (HOSPITAL_BASED_OUTPATIENT_CLINIC_OR_DEPARTMENT_OTHER): Payer: Self-pay | Admitting: *Deleted

## 2015-12-22 DIAGNOSIS — R300 Dysuria: Secondary | ICD-10-CM | POA: Insufficient documentation

## 2015-12-22 DIAGNOSIS — N3090 Cystitis, unspecified without hematuria: Secondary | ICD-10-CM | POA: Diagnosis not present

## 2015-12-22 LAB — COMPREHENSIVE METABOLIC PANEL
ALK PHOS: 67 U/L (ref 38–126)
ALT: 13 U/L — ABNORMAL LOW (ref 14–54)
AST: 20 U/L (ref 15–41)
Albumin: 4.8 g/dL (ref 3.5–5.0)
Anion gap: 12 (ref 5–15)
BILIRUBIN TOTAL: 1.6 mg/dL — AB (ref 0.3–1.2)
BUN: 5 mg/dL — AB (ref 6–20)
CALCIUM: 9.7 mg/dL (ref 8.9–10.3)
CO2: 24 mmol/L (ref 22–32)
CREATININE: 0.71 mg/dL (ref 0.44–1.00)
Chloride: 101 mmol/L (ref 101–111)
GFR calc Af Amer: >60 mL/min (ref >60)
GLUCOSE: 104 mg/dL — AB (ref 65–99)
Potassium: 3.9 mmol/L (ref 3.5–5.1)
Sodium: 137 mmol/L (ref 135–145)
TOTAL PROTEIN: 8.4 g/dL — AB (ref 6.5–8.1)

## 2015-12-22 LAB — CBC
HCT: 43.8 % (ref 36.0–46.0)
Hemoglobin: 15.1 g/dL — ABNORMAL HIGH (ref 12.0–15.0)
MCH: 31.2 pg (ref 26.0–34.0)
MCHC: 34.5 g/dL (ref 30.0–36.0)
MCV: 90.5 fL (ref 78.0–100.0)
PLATELETS: 264 10*3/uL (ref 150–400)
RBC: 4.84 MIL/uL (ref 3.87–5.11)
RDW: 11.9 % (ref 11.5–15.5)
WBC: 14 10*3/uL — AB (ref 4.0–10.5)

## 2015-12-22 LAB — URINALYSIS, ROUTINE W REFLEX MICROSCOPIC
BILIRUBIN URINE: NEGATIVE
GLUCOSE, UA: NEGATIVE mg/dL
Ketones, ur: 15 mg/dL — AB
Nitrite: POSITIVE — AB
SPECIFIC GRAVITY, URINE: 1.01 (ref 1.005–1.030)
pH: 7 (ref 5.0–8.0)

## 2015-12-22 LAB — URINE MICROSCOPIC-ADD ON

## 2015-12-22 LAB — PREGNANCY, URINE: PREG TEST UR: NEGATIVE

## 2015-12-22 NOTE — ED Notes (Signed)
Pt c/o bil flank pain x 2 days , recent DX UTI

## 2015-12-23 ENCOUNTER — Emergency Department (HOSPITAL_BASED_OUTPATIENT_CLINIC_OR_DEPARTMENT_OTHER)
Admission: EM | Admit: 2015-12-23 | Discharge: 2015-12-23 | Disposition: A | Discharge status: Home / Self Care | Payer: BLUE CROSS/BLUE SHIELD | Attending: Emergency Medicine | Admitting: Emergency Medicine

## 2015-12-23 ENCOUNTER — Encounter (HOSPITAL_BASED_OUTPATIENT_CLINIC_OR_DEPARTMENT_OTHER): Payer: Self-pay | Admitting: Emergency Medicine

## 2015-12-23 DIAGNOSIS — N3090 Cystitis, unspecified without hematuria: Secondary | ICD-10-CM

## 2015-12-23 MED ORDER — PHENAZOPYRIDINE HCL 100 MG PO TABS
200.0000 mg | ORAL_TABLET | Freq: Once | ORAL | Status: AC
  Administered 2015-12-23: 200 mg via ORAL
  Filled 2015-12-23: qty 2

## 2015-12-23 MED ORDER — PHENAZOPYRIDINE HCL 200 MG PO TABS: 200.0000 mg | ORAL_TABLET | Freq: Three times a day (TID) | ORAL | Status: DC

## 2015-12-23 MED ORDER — NAPROXEN 500 MG PO TABS: 500.0000 mg | ORAL_TABLET | Freq: Two times a day (BID) | ORAL | Status: DC

## 2015-12-23 MED ORDER — LIDOCAINE HCL (PF) 1 % IJ SOLN
INTRAMUSCULAR | Status: AC
  Administered 2015-12-23: 2.1 mL
  Filled 2015-12-23: qty 5

## 2015-12-23 MED ORDER — KETOROLAC TROMETHAMINE 60 MG/2ML IM SOLN
60.0000 mg | Freq: Once | INTRAMUSCULAR | Status: AC
  Administered 2015-12-23: 60 mg via INTRAMUSCULAR
  Filled 2015-12-23: qty 2

## 2015-12-23 MED ORDER — CEFTRIAXONE SODIUM 1 G IJ SOLR
1.0000 g | Freq: Once | INTRAMUSCULAR | Status: AC
  Administered 2015-12-23: 1 g via INTRAMUSCULAR
  Filled 2015-12-23: qty 10

## 2015-12-23 MED ORDER — ONDANSETRON 8 MG PO TBDP: ORAL_TABLET | ORAL | Status: DC

## 2015-12-23 MED ORDER — CEPHALEXIN 500 MG PO CAPS: 500.0000 mg | ORAL_CAPSULE | Freq: Four times a day (QID) | ORAL | Status: DC

## 2015-12-23 NOTE — Discharge Instructions (Signed)

## 2015-12-23 NOTE — ED Notes (Signed)
Pt verbalizes understanding of d/c instructions and denies any further needs at this time. 

## 2015-12-23 NOTE — ED Provider Notes (Signed)
CSN: [id number]     Arrival date & time 12/22/15  2210 History   First MD Initiated Contact with Patient 12/23/15 0109     Chief Complaint  Patient presents with  . Flank Pain     (Consider location/radiation/quality/duration/timing/severity/associated sxs/prior Treatment) Patient is a 25 y.o. female presenting with dysuria. The history is provided by the patient.  Dysuria Pain quality:  Burning Pain severity:  Moderate Onset quality:  Gradual Duration:  1 week Timing:  Constant Progression:  Unchanged Chronicity:  New Relieved by:  Nothing Worsened by:  Nothing tried Urinary symptoms: frequent urination   Urinary symptoms: no discolored urine and no hematuria   Associated symptoms: no abdominal pain, no fever, no flank pain, no nausea and no vomiting   Risk factors: no hx of pyelonephritis and not pregnant   Seen a week ago at urgent care for dehydration related to n/v/d and told she had a UTI but was only given and RX for an antiemetic and not abx.  Urinary symptoms worsening no n/v/d.  No f/c/r.    Past Medical History  Diagnosis Date  . Acne     Dr Sherryl Bartersafeen's NP  . Anxiety    Past Surgical History  Procedure Laterality Date  . Wisdom tooth extraction     Family History  Problem Relation Age of Onset  . Hypertension Father   . Diabetes Maternal Grandfather   . Heart attack Maternal Grandfather   . Stroke Maternal Grandfather   . Cancer Maternal Grandfather     Bladder  . Prostate cancer Maternal Grandfather   . Diabetes Paternal Grandfather    Social History  Substance Use Topics  . Smoking status: Never Smoker   . Smokeless tobacco: None  . Alcohol Use: Yes   OB History    No data available     Review of Systems  Constitutional: Negative for fever, appetite change and fatigue.  Gastrointestinal: Negative for nausea, vomiting and abdominal pain.  Genitourinary: Positive for dysuria. Negative for flank pain and difficulty urinating.  All other systems  reviewed and are negative.     Allergies  Review of patient's allergies indicates no known allergies.  Home Medications   Prior to Admission medications   Medication Sig Start Date End Date Taking? Authorizing Provider  ALPRAZolam (XANAX) 0.25 MG tablet Take 1 tablet (0.25 mg total) by mouth 3 (three) times daily as needed for anxiety. 11/04/15   Sheliah HatchKatherine E Tabori, MD  etonogestrel (NEXPLANON) 68 MG IMPL implant 1 each by Subdermal route once.    Historical Provider, MD  ranitidine (ZANTAC) 150 MG tablet Take 1 tablet (150 mg total) by mouth 2 (two) times daily. 08/14/15   Sheliah HatchKatherine E Tabori, MD  sertraline (ZOLOFT) 50 MG tablet Take 1.5 tablets (75 mg total) by mouth daily. 08/03/15   Sheliah HatchKatherine E Tabori, MD   BP 148/110 mmHg  Pulse 100  Temp(Src) 99.1 F (37.3 C) (Oral)  Resp 16  Ht 5\' 5"  (1.651 m)  Wt 135 lb (61.236 kg)  BMI 22.47 kg/m2  SpO2 100% Physical Exam  Constitutional: She is oriented to person, place, and time. She appears well-developed and well-nourished.  HENT:  Head: Normocephalic and atraumatic.  Mouth/Throat: Oropharynx is clear and moist.  Eyes: Conjunctivae are normal. Pupils are equal, round, and reactive to light.  Neck: Normal range of motion. Neck supple.  Cardiovascular: Normal rate, regular rhythm and intact distal pulses.   Pulmonary/Chest: Effort normal and breath sounds normal. No respiratory distress. She has  no wheezes. She has no rales.  Abdominal: Soft. Bowel sounds are normal. There is no tenderness. There is no rebound and no guarding.  Musculoskeletal: Normal range of motion.  Neurological: She is alert and oriented to person, place, and time.  Skin: Skin is warm and dry.  Psychiatric: She has a normal mood and affect.    ED Course  Procedures (including critical care time) Labs Review Labs Reviewed  URINALYSIS, ROUTINE W REFLEX MICROSCOPIC (NOT AT [facility]) - Abnormal; Notable for the following:    Color, Urine ORANGE (*)    APPearance  CLOUDY (*)    Hgb urine dipstick LARGE (*)    Ketones, ur 15 (*)    Protein, ur >300 (*)    Nitrite POSITIVE (*)    Leukocytes, UA LARGE (*)    All other components within normal limits  CBC - Abnormal; Notable for the following:    WBC 14.0 (*)    Hemoglobin 15.1 (*)    All other components within normal limits  COMPREHENSIVE METABOLIC PANEL - Abnormal; Notable for the following:    Glucose, Bld 104 (*)    BUN 5 (*)    Total Protein 8.4 (*)    ALT 13 (*)    Total Bilirubin 1.6 (*)    All other components within normal limits  URINE MICROSCOPIC-ADD ON - Abnormal; Notable for the following:    Squamous Epithelial / LPF 6-30 (*)    Bacteria, UA MANY (*)    All other components within normal limits  PREGNANCY, URINE    Imaging Review No results found. I have personally reviewed and evaluated these images and lab results as part of my medical decision-making.   EKG Interpretation None      MDM   Final diagnoses:  None   Filed Vitals:   12/22/15 2214 12/23/15 0152  BP: 148/110 132/92  Pulse: 100 89  Temp: 99.1 F (37.3 C) 98.4 F (36.9 C)  Resp: 16 16   Results for orders placed or performed during the hospital encounter of 12/23/15  Pregnancy, urine  Result Value Ref Range   Preg Test, Ur NEGATIVE NEGATIVE  Urinalysis, Routine w reflex microscopic (not at [facility])  Result Value Ref Range   Color, Urine ORANGE (A) YELLOW   APPearance CLOUDY (A) CLEAR   Specific Gravity, Urine 1.010 1.005 - 1.030   pH 7.0 5.0 - 8.0   Glucose, UA NEGATIVE NEGATIVE mg/dL   Hgb urine dipstick LARGE (A) NEGATIVE   Bilirubin Urine NEGATIVE NEGATIVE   Ketones, ur 15 (A) NEGATIVE mg/dL   Protein, ur >161 (A) NEGATIVE mg/dL   Nitrite POSITIVE (A) NEGATIVE   Leukocytes, UA LARGE (A) NEGATIVE  CBC  Result Value Ref Range   WBC 14.0 (H) 4.0 - 10.5 K/uL   RBC 4.84 3.87 - 5.11 MIL/uL   Hemoglobin 15.1 (H) 12.0 - 15.0 g/dL   HCT 09.6 04.5 - 40.9 %   MCV 90.5 78.0 - 100.0 fL   MCH 31.2  26.0 - 34.0 pg   MCHC 34.5 30.0 - 36.0 g/dL   RDW 81.1 91.4 - 78.2 %   Platelets 264 150 - 400 K/uL  Comprehensive metabolic panel  Result Value Ref Range   Sodium 137 135 - 145 mmol/L   Potassium 3.9 3.5 - 5.1 mmol/L   Chloride 101 101 - 111 mmol/L   CO2 24 22 - 32 mmol/L   Glucose, Bld 104 (H) 65 - 99 mg/dL   BUN 5 (L) 6 -  20 mg/dL   Creatinine, Ser 1.61 0.44 - 1.00 mg/dL   Calcium 9.7 8.9 - 09.6 mg/dL   Total Protein 8.4 (H) 6.5 - 8.1 g/dL   Albumin 4.8 3.5 - 5.0 g/dL   AST 20 15 - 41 U/L   ALT 13 (L) 14 - 54 U/L   Alkaline Phosphatase 67 38 - 126 U/L   Total Bilirubin 1.6 (H) 0.3 - 1.2 mg/dL   GFR calc non Af Amer >60 >60 mL/min   GFR calc Af Amer >60 >60 mL/min   Anion gap 12 5 - 15  Urine microscopic-add on  Result Value Ref Range   Squamous Epithelial / LPF 6-30 (A) NONE SEEN   WBC, UA TOO NUMEROUS TO COUNT 0 - 5 WBC/hpf   RBC / HPF TOO NUMEROUS TO COUNT 0 - 5 RBC/hpf   Bacteria, UA MANY (A) NONE SEEN   No results found.  Medications  cefTRIAXone (ROCEPHIN) injection 1 g (1 g Intramuscular Given 12/23/15 0155)  ketorolac (TORADOL) injection 60 mg (60 mg Intramuscular Given 12/23/15 0155)  phenazopyridine (PYRIDIUM) tablet 200 mg (200 mg Oral Given 12/23/15 0154)  lidocaine (PF) (XYLOCAINE) 1 % injection (2.1 mLs  Given 12/23/15 0155)   Well appearing and resting comfortably in room  Patient points to the low back B as the additional source of pain.  I do not believe the patient has kidney stones I believe her UTI has progressed to cystitis thus the blood in her urine.  10 days of antibiotics.  NSAIDs, pyridium and close follow up with your PMD.  Strict return precautions given.  Patient and mother verbalize understanding and agree to follow up    Shayda Kalka, MD 12/23/15 [phone]

## 2016-03-12 DIAGNOSIS — R112 Nausea with vomiting, unspecified: Secondary | ICD-10-CM | POA: Diagnosis not present

## 2016-03-12 DIAGNOSIS — E860 Dehydration: Secondary | ICD-10-CM | POA: Diagnosis not present

## 2016-03-13 DIAGNOSIS — R112 Nausea with vomiting, unspecified: Secondary | ICD-10-CM | POA: Diagnosis not present

## 2016-03-13 DIAGNOSIS — E860 Dehydration: Secondary | ICD-10-CM | POA: Diagnosis not present

## 2016-05-28 ENCOUNTER — Other Ambulatory Visit: Payer: Self-pay | Admitting: Family Medicine

## 2016-08-17 ENCOUNTER — Encounter: Payer: BLUE CROSS/BLUE SHIELD | Admitting: Family Medicine

## 2016-08-17 DIAGNOSIS — Z0289 Encounter for other administrative examinations: Secondary | ICD-10-CM

## 2016-12-13 DIAGNOSIS — Z01419 Encounter for gynecological examination (general) (routine) without abnormal findings: Secondary | ICD-10-CM | POA: Diagnosis not present

## 2016-12-13 DIAGNOSIS — Z113 Encounter for screening for infections with a predominantly sexual mode of transmission: Secondary | ICD-10-CM | POA: Diagnosis not present

## 2016-12-13 DIAGNOSIS — Z6822 Body mass index (BMI) 22.0-22.9, adult: Secondary | ICD-10-CM | POA: Diagnosis not present

## 2016-12-23 ENCOUNTER — Ambulatory Visit (INDEPENDENT_AMBULATORY_CARE_PROVIDER_SITE_OTHER): Payer: BLUE CROSS/BLUE SHIELD | Admitting: Family Medicine

## 2016-12-23 ENCOUNTER — Encounter: Payer: Self-pay | Admitting: Family Medicine

## 2016-12-23 VITALS — BP 123/83 | HR 82 | Resp 17 | Ht 65.0 in | Wt 139.1 lb

## 2016-12-23 DIAGNOSIS — F411 Generalized anxiety disorder: Secondary | ICD-10-CM

## 2016-12-23 MED ORDER — ALPRAZOLAM 0.25 MG PO TABS: 0.2500 mg | ORAL_TABLET | Freq: Three times a day (TID) | ORAL | 0 refills | Status: DC | PRN

## 2016-12-23 MED ORDER — SERTRALINE HCL 50 MG PO TABS: ORAL_TABLET | ORAL | 3 refills | Status: DC

## 2016-12-23 NOTE — Progress Notes (Signed)
   Subjective:    Patient ID: Tasha Hanna, female    DOB: 01/13/1991, 26 y.o.   MRN: [id number]  HPI Anxiety- pt has been on Sertraline for quite awhile but stopped for a few months.  Recently restarted when anxiety spiked but restarted at 75mg  dose.  Due to N/V at this dose, she stopped again.  Pt has upcoming trip to [place]- she gets nervous flying.  Typically requires Alprazolam for flights.     Review of Systems For ROS see HPI     Objective:   Physical Exam  Constitutional: She is oriented to person, place, and time. She appears well-developed and well-nourished. No distress.  HENT:  Head: Normocephalic and atraumatic.  Eyes: Conjunctivae and EOM are normal. Pupils are equal, round, and reactive to light.  Neck: Normal range of motion. Neck supple. No thyromegaly present.  Cardiovascular: Normal rate, regular rhythm, normal heart sounds and intact distal pulses.   No murmur heard. Pulmonary/Chest: Effort normal and breath sounds normal. No respiratory distress.  Abdominal: Soft. She exhibits no distension. There is no tenderness.  Musculoskeletal: She exhibits no edema.  Lymphadenopathy:    She has no cervical adenopathy.  Neurological: She is alert and oriented to person, place, and time.  Skin: Skin is warm and dry.  Psychiatric: She has a normal mood and affect. Her behavior is normal.  Vitals reviewed.         Assessment & Plan:

## 2016-12-23 NOTE — Progress Notes (Signed)
Pre visit review using our clinic review tool, if applicable. No additional management support is needed unless otherwise documented below in the visit note. 

## 2016-12-23 NOTE — Patient Instructions (Addendum)
Schedule your complete physical in 6 months Restart the Zoloft but start w/ 1/2 tab x1 week and then increase to full tab after that.  Only go back to 1.5 tabs if anxiety is not controlled after 2 weeks on full tablet Use the Alprazolam as needed for flights or high anxiety Call with any questions or concerns BE SAFE, HAVE FUN!!!

## 2016-12-23 NOTE — Assessment & Plan Note (Signed)
Deteriorated.  Pt had stopped her medication for a few months and when anxiety returned, she tried to resume at the 75mg  dose- causing N/V.  Will restart Sertraline at 25mg  x1 week and then increase to 50mg  daily.  If needed, will go back to the 75mg  daily.  Pt to use Alprazolam only as needed.  Pt expressed understanding and is in agreement w/ plan.

## 2017-03-14 DIAGNOSIS — S60221A Contusion of right hand, initial encounter: Secondary | ICD-10-CM | POA: Diagnosis not present

## 2017-06-26 LAB — HM PAP SMEAR

## 2017-09-10 DIAGNOSIS — M25562 Pain in left knee: Secondary | ICD-10-CM | POA: Diagnosis not present

## 2018-01-09 ENCOUNTER — Encounter: Payer: Self-pay | Admitting: General Practice

## 2018-01-25 ENCOUNTER — Other Ambulatory Visit: Payer: Self-pay

## 2018-01-25 ENCOUNTER — Encounter (HOSPITAL_BASED_OUTPATIENT_CLINIC_OR_DEPARTMENT_OTHER): Payer: Self-pay | Admitting: Emergency Medicine

## 2018-01-25 ENCOUNTER — Emergency Department (HOSPITAL_BASED_OUTPATIENT_CLINIC_OR_DEPARTMENT_OTHER): Payer: BLUE CROSS/BLUE SHIELD

## 2018-01-25 ENCOUNTER — Emergency Department (HOSPITAL_BASED_OUTPATIENT_CLINIC_OR_DEPARTMENT_OTHER)
Admission: EM | Admit: 2018-01-25 | Discharge: 2018-01-25 | Disposition: A | Discharge status: Home / Self Care | Payer: BLUE CROSS/BLUE SHIELD | Attending: Emergency Medicine | Admitting: Emergency Medicine

## 2018-01-25 DIAGNOSIS — Z79899 Other long term (current) drug therapy: Secondary | ICD-10-CM | POA: Diagnosis not present

## 2018-01-25 DIAGNOSIS — L089 Local infection of the skin and subcutaneous tissue, unspecified: Secondary | ICD-10-CM

## 2018-01-25 DIAGNOSIS — M79645 Pain in left finger(s): Secondary | ICD-10-CM | POA: Diagnosis not present

## 2018-01-25 DIAGNOSIS — M7989 Other specified soft tissue disorders: Secondary | ICD-10-CM | POA: Diagnosis not present

## 2018-01-25 MED ORDER — IBUPROFEN 800 MG PO TABS: 800.0000 mg | ORAL_TABLET | Freq: Three times a day (TID) | ORAL | 0 refills | Status: DC | PRN

## 2018-01-25 MED ORDER — SULFAMETHOXAZOLE-TRIMETHOPRIM 800-160 MG PO TABS: 1.0000 | ORAL_TABLET | Freq: Two times a day (BID) | ORAL | 0 refills | Status: AC

## 2018-01-25 MED ORDER — CEPHALEXIN 500 MG PO CAPS: 500.0000 mg | ORAL_CAPSULE | Freq: Four times a day (QID) | ORAL | 0 refills | Status: DC

## 2018-01-25 MED FILL — IBUPROFEN 800 MG TAB: 800 | 7 days supply | Qty: 21 | Fill #0

## 2018-01-25 MED FILL — CEPHALEXIN 500 MG CAPSULE: 500 | 7 days supply | Qty: 28 | Fill #0

## 2018-01-25 MED FILL — SULFAMETHOXAZOLE-TMP DS TAB: 800-160 | 7 days supply | Qty: 14 | Fill #0

## 2018-01-25 NOTE — ED Triage Notes (Signed)
States hit her left thumb on a pool chair on Sunday , pain had since subsided but today has swelling and pain to thumb

## 2018-01-25 NOTE — ED Provider Notes (Signed)
[facility] EMERGENCY DEPARTMENT Provider Note   CSN: [id number] Arrival date & time: 01/25/18  4098     History   Chief Complaint Chief Complaint  Patient presents with  . Finger Injury    HPI Tasha Hanna is a 27 y.o. female.  The history is provided by the patient and medical records. No language interpreter was used.   Tasha Hanna is a 27 y.o. female  with a PMH as listed below who presents to the Emergency Department complaining of pain and swelling to the left thumb.  She hit her thumb in a pool chair Sunday.  It was a little sore, but she did not think much of this.  She did not have pain Monday or Tuesday.  She woke up yesterday and noticed swelling around the proximal nailbed area.  It was a little red.  This morning, pain and swelling intensified.  There is been no drainage from the area.  No fever or chills.  She took Tylenol with no improvement.  Pain is worse with palpation and better when still and not touching it.  No history of similar.  Past Medical History:  Diagnosis Date  . Acne    Dr Sherryl Barters NP  . Anxiety     Patient Active Problem List   Diagnosis Date Noted  . Chest pain 08/14/2015  . Anxiety state 08/03/2015  . Fear of flying 10/07/2014  . Sialadenitis 04/03/2014  . Lymphadenopathy 04/02/2014  . Pharyngitis 04/02/2014  . Otitis media 09/03/2013  . Screening for malignant neoplasm of the cervix 07/09/2013  . Streptococcal sore throat 10/03/2012  . Epigastric pain 09/13/2012  . Low back pain 02/06/2012  . Gait abnormality 02/06/2012  . Bunion 02/06/2012  . Birth control 01/25/2012  . Leg length inequality 01/25/2012  . Physical exam 04/04/2011  . Postauricular lymphadenopathy 01/24/2011  . Vaginitis 01/06/2011  . OTHER PREMATURE BEATS 09/25/2009  . DYSMENORRHEA 04/22/2009  . MOOD SWINGS 03/27/2009  . HEADACHE 06/12/2008    Past Surgical History:  Procedure Laterality Date  . WISDOM TOOTH EXTRACTION       OB History    None      Home Medications    Prior to Admission medications   Medication Sig Start Date End Date Taking? Authorizing Provider  sertraline (ZOLOFT) 50 MG tablet Take by mouth. 02/29/16  Yes [provider]  ALPRAZolam (XANAX) 0.25 MG tablet Take 1 tablet (0.25 mg total) by mouth 3 (three) times daily as needed for anxiety. 12/23/16   Sheliah Hatch, MD  cephALEXin (KEFLEX) 500 MG capsule Take 1 capsule (500 mg total) by mouth 4 (four) times daily. 01/25/18   Ward, Chase Picket, PA-C  etonogestrel (NEXPLANON) 68 MG IMPL implant 1 each by Subdermal route once.    [provider]  ibuprofen (ADVIL,MOTRIN) 800 MG tablet Take 1 tablet (800 mg total) by mouth every 8 (eight) hours as needed. 01/25/18   Ward, Chase Picket, PA-C  sertraline (ZOLOFT) 50 MG tablet TAKE 1 AND 1/2 TABLETS(75 MG) BY MOUTH DAILY 12/23/16   Sheliah Hatch, MD  sulfamethoxazole-trimethoprim (BACTRIM DS,SEPTRA DS) 800-160 MG tablet Take 1 tablet by mouth 2 (two) times daily for 7 days. 01/25/18 02/01/18  Ward, Chase Picket, PA-C    Family History Family History  Problem Relation Age of Onset  . Hypertension Father   . Diabetes Maternal Grandfather   . Heart attack Maternal Grandfather   . Stroke Maternal Grandfather   . Cancer Maternal Grandfather  Bladder  . Prostate cancer Maternal Grandfather   . Diabetes Paternal Grandfather     Social History Social History   Tobacco Use  . Smoking status: Never Smoker  . Smokeless tobacco: Never Used  Substance Use Topics  . Alcohol use: Yes  . Drug use: No     Allergies   Patient has no known allergies.   Review of Systems Review of Systems  Constitutional: Negative for chills and fever.  Musculoskeletal: Positive for myalgias.  Skin: Positive for color change.  Neurological: Negative for weakness and numbness.     Physical Exam Updated Vital Signs BP (!) 134/103 (BP Location: Right Arm)   Pulse 77   Temp 98.4 F (36.9 C)  (Oral)   Resp 18   Ht 5\' 5"  (1.651 m)   Wt 65.8 kg (145 lb)   SpO2 100%   BMI 24.13 kg/m   Physical Exam  Constitutional: She appears well-developed and well-nourished. No distress.  HENT:  Head: Normocephalic and atraumatic.  Neck: Neck supple.  Cardiovascular: Normal rate, regular rhythm and normal heart sounds.  No murmur heard. Pulmonary/Chest: Effort normal and breath sounds normal. No respiratory distress. She has no wheezes. She has no rales.  Musculoskeletal:  Erythema and tenderness to proximal nailbed. No fluctuance to suggest paronychia / abscess. No tenderness to the finger pad to suggest felon. Left thumb with full ROM. Good cap refill. Sensation intact. No open wounds.   Neurological: She is alert.  Skin: Skin is dry.  Nursing note and vitals reviewed.    ED Treatments / Results  Labs (all labs ordered are listed, but only abnormal results are displayed) Labs Reviewed - No data to display  EKG None  Radiology Dg Finger Thumb Left  Result Date: 01/25/2018 CLINICAL DATA:  Pt having red and swollen left thumb for a few days with no injury,pain as well and pain going into 2 finger EXAM: LEFT THUMB 2+V COMPARISON:  None. FINDINGS: There is mild soft tissue swelling. There is no acute fracture or subluxation. Soft tissue swelling of the distal thumb. A small radiopaque density measures 2 millimeters in length at the level of the interphalangeal joint of the thumb. This could represent minimal degenerative change but a small foreign body cannot be excluded. There is no associated soft tissue gas. IMPRESSION: 1. No acute fracture. 2. Soft tissue swelling. 3. Possible radiopaque foreign body versus degenerative change at the interphalangeal joint as described. Electronically Signed   By: Norva Pavlov M.D.   On: 01/25/2018 08:29    Procedures Procedures (including critical care time)  Medications Ordered in ED Medications - No data to display   Initial Impression /  Assessment and Plan / ED Course  I have reviewed the triage vital signs and the nursing notes.  Pertinent labs & imaging results that were available during my care of the patient were reviewed by me and considered in my medical decision making (see chart for details).    Tasha Hanna is a 27 y.o. female who presents to ED for swelling and erythema to left thumb. NVI on exam. No fluctuance to suggest paronychia / abscess. No tenderness to the finger pad to suggest felon. X-ray reviewed with no acute fx. Does show soft tissue swelling as well as possible foreign body vs. Degenerative changes. No open wounds or hx concerning for foreign body. Will tx with ABX. Home care instructions including warm soaks discussed. Return precautions given. PCP follow up if symptoms persist. All questions answered.  Final Clinical Impressions(s) / ED Diagnoses   Final diagnoses:  Skin infection    ED Discharge Orders        Ordered    sulfamethoxazole-trimethoprim (BACTRIM DS,SEPTRA DS) 800-160 MG tablet  2 times daily     01/25/18 1012    cephALEXin (KEFLEX) 500 MG capsule  4 times daily     01/25/18 1012    ibuprofen (ADVIL,MOTRIN) 800 MG tablet  Every 8 hours PRN     01/25/18 1013       Ward, Chase PicketJaime Pilcher, PA-C 01/25/18 1023    Tilden Fossaees, Elizabeth, MD 01/26/18 [id number]

## 2018-01-25 NOTE — Discharge Instructions (Addendum)
It was my pleasure taking care of you today!  Please take all of your antibiotics until finished!  Warm compresses or soaks to the area at least twice daily.   Ibuprofen as needed for pain. You can alternate with Tylenol if needed for additional pain relief.   Follow up with your primary care doctor if you are not noticing any improvement by Monday morning.   Return to ER for fever, worsening redness, new or worsening symptoms, any additional concerns.

## 2018-06-26 ENCOUNTER — Encounter: Payer: Self-pay | Admitting: Family Medicine

## 2018-06-26 ENCOUNTER — Ambulatory Visit: Payer: Self-pay | Admitting: Family Medicine

## 2018-06-26 ENCOUNTER — Other Ambulatory Visit: Payer: Self-pay

## 2018-06-26 VITALS — BP 118/80 | HR 85 | Temp 98.5°F | Resp 16 | Ht 65.0 in | Wt 154.5 lb

## 2018-06-26 DIAGNOSIS — F32A Depression, unspecified: Secondary | ICD-10-CM

## 2018-06-26 DIAGNOSIS — F329 Major depressive disorder, single episode, unspecified: Secondary | ICD-10-CM

## 2018-06-26 DIAGNOSIS — F419 Anxiety disorder, unspecified: Secondary | ICD-10-CM

## 2018-06-26 MED ORDER — CITALOPRAM HYDROBROMIDE 20 MG PO TABS: 20.0000 mg | ORAL_TABLET | Freq: Every day | ORAL | 3 refills | Status: DC

## 2018-06-26 NOTE — Patient Instructions (Signed)
Follow up in 3-4 weeks to recheck mood START the Citalopram 20mg  daily STOP the Sertraline CALL and schedule counseling at [facility] [place] [phone] Call with any questions or concerns Hang in there!!!

## 2018-06-26 NOTE — Assessment & Plan Note (Signed)
Deteriorated.  Medication is no longer effective but higher dose is causing nausea.  Will switch to Citalopram and strongly encouraged counseling.  Discussed [facility] [place] b/c they counsel on a sliding scale.  Will follow closely.

## 2018-06-26 NOTE — Progress Notes (Signed)
   Subjective:    Patient ID: Tasha Hanna, female    DOB: 09/03/1990, 27 y.o.   MRN: [id number]  HPI Anxiety/depression- pt is on Zoloft daily but scored a 15 on the PHQ 9.  She doesn't feel that the 50mg  dose is doing anything but the 75mg  causes nausea.  + depression along w/ anxiety.  Having 'unprovoked' panic attacks.  Lost her job in July.  Struggling financially.  Has not finished her degree and is finding that 'i'm not qualified for much'.  Not currently in school.  Mom is here w/ her today and is supportive.  Denies SI/HI   Review of Systems For ROS see HPI    Objective:   Physical Exam  Constitutional: She is oriented to person, place, and time. She appears well-developed and well-nourished. No distress.  HENT:  Head: Normocephalic and atraumatic.  Neurological: She is alert and oriented to person, place, and time.  Psychiatric: Her behavior is normal. Judgment and thought content normal.  Flat affect  Vitals reviewed.         Assessment & Plan:

## 2018-07-02 ENCOUNTER — Ambulatory Visit: Payer: Self-pay | Admitting: *Deleted

## 2018-07-02 ENCOUNTER — Telehealth: Payer: Self-pay | Admitting: Family Medicine

## 2018-07-02 NOTE — Telephone Encounter (Signed)
Pt's mother calling, patient present during call. Had called earlier and note directed to Dr. Beverely Low,   Pt triaged with this call. Seen by Dr. Beverely Low 06/26/18 and on started Celexa ; took first dose that day.  Reports by  Friday 06/29/18 had episodes of nausea with vomiting, lightheadedness, "Feeling jittery and shaky."  Did not take Celexa yesterday. Symptoms remain presently. Reports 4 episodes of vomiting this AM, remains lightheaded  "If I stand up for very long time." Reports BP this AM 150/100. [place] called practice and spoke with [facility]. Informed pt Dr. Beverely Low will advise. Pt directed to ED if symptoms worsen.   Reason for Disposition . Caller has URGENT medication question about med that PCP prescribed and triager unable to answer question  Answer Assessment - Initial Assessment Questions 1. SYMPTOMS: "Do you have any symptoms?"     Lightheadedness, nausea, vomiting this am 4-5 times, shaky, jittery. 2. SEVERITY: If symptoms are present, ask "Are they mild, moderate or severe?" Severe  Protocols used: MEDICATION QUESTION CALL-A-AH

## 2018-07-02 NOTE — Telephone Encounter (Signed)
Copied from CRM [id number]. Topic: Quick Communication - See Telephone Encounter >> Jul 02, 2018  9:09 AM Terisa Starr wrote: CRM for notification. See Telephone encounter for: 07/02/18.  Patient's mother called and said that citalopram (CELEXA) 20 MG tablet has made her feel weird. She said it was making her feel dizzy and anxious. She stopped taking it yesterday. Please call patient.

## 2018-07-02 NOTE — Telephone Encounter (Signed)
Spoke with patient and she was given the advice below from Dr. Beverely Low.  She did want to make an appointment for tomorrow, so I have scheduled her for 10:30. Patient is aware that if sxs become worse or if she develops new sxs she needs to seek treatment in the ED. Patient stated verbal understanding.

## 2018-07-02 NOTE — Telephone Encounter (Signed)
Pt is coming in tomorrow  °

## 2018-07-02 NOTE — Telephone Encounter (Signed)
Pt needs to drink plenty of fluids, rest, HOLD medication, and if possible, be seen for an appt tomorrow if sxs don't improve

## 2018-07-03 ENCOUNTER — Encounter: Payer: Self-pay | Admitting: Family Medicine

## 2018-07-03 ENCOUNTER — Ambulatory Visit: Payer: Self-pay | Admitting: Family Medicine

## 2018-07-03 ENCOUNTER — Other Ambulatory Visit: Payer: Self-pay

## 2018-07-03 VITALS — BP 112/84 | HR 86 | Temp 98.7°F | Resp 16 | Ht 65.0 in | Wt 151.5 lb

## 2018-07-03 DIAGNOSIS — F419 Anxiety disorder, unspecified: Secondary | ICD-10-CM

## 2018-07-03 DIAGNOSIS — F329 Major depressive disorder, single episode, unspecified: Secondary | ICD-10-CM

## 2018-07-03 DIAGNOSIS — T50905A Adverse effect of unspecified drugs, medicaments and biological substances, initial encounter: Secondary | ICD-10-CM

## 2018-07-03 DIAGNOSIS — F32A Depression, unspecified: Secondary | ICD-10-CM

## 2018-07-03 MED ORDER — ONDANSETRON HCL 4 MG PO TABS: 4.0000 mg | ORAL_TABLET | Freq: Three times a day (TID) | ORAL | 0 refills | Status: DC | PRN

## 2018-07-03 MED ORDER — BUSPIRONE HCL 15 MG PO TABS: 15.0000 mg | ORAL_TABLET | Freq: Two times a day (BID) | ORAL | 3 refills | Status: DC

## 2018-07-03 NOTE — Progress Notes (Signed)
   Subjective:    Patient ID: Tasha Hanna, female    DOB: 11/30/1990, 27 y.o.   MRN: [id number]  HPI Medication reaction- was started on Celexa at last visit.  Took meds on Tuesday and by Thursday was dizzy, Saturday developed nausea.  'I just felt like I was high the whole time I was on it'.  Threw up in the AM since Friday but did not vomit today.  Stopped med on Saturday.  Pt felt more anxious on med than when she started.     Review of Systems For ROS see HPI     Objective:   Physical Exam  Constitutional: She is oriented to person, place, and time. She appears well-developed and well-nourished. No distress.  HENT:  Head: Normocephalic and atraumatic.  Eyes: Pupils are equal, round, and reactive to light. EOM are normal.  Cardiovascular: Normal rate.  Pulmonary/Chest: Effort normal. No respiratory distress.  Neurological: She is alert and oriented to person, place, and time.  Skin: Skin is warm and dry. No pallor.  Psychiatric: She has a normal mood and affect. Her behavior is normal. Thought content normal.  Vitals reviewed.         Assessment & Plan:  Medication reaction- new.  Pt did not tolerate her Citalopram.  sxs have improved since stopping medication w/ exception of nausea- which she had prior to starting medication due to anxiety.  Zofran as needed for nausea.  Will follow.

## 2018-07-03 NOTE — Patient Instructions (Addendum)
Follow up in 2-3 weeks to recheck anxiety STOP the Citalopram START the Buspirone twice daily- start w/ 1/2 tab twice daily and we will increase to 1 tab twice daily if needed Take w/ food Take the Zofran as needed for nausea Call with any questions or concerns Hang in there!!!

## 2018-07-03 NOTE — Assessment & Plan Note (Signed)
Ongoing.  She was not able to tolerate the Celexa due to nausea/vomiting, dizziness.  Will switch to Buspar to address her anxiety w/o the typical SSRI side effects.  Start w/ 1/2 tab twice daily and we will increase if needed.  Reviewed supportive care and red flags that should prompt return.  Pt expressed understanding and is in agreement w/ plan.

## 2018-07-18 IMAGING — DX DG FINGER THUMB 2+V*L*
3 series · 3 of 3 positions shown · non-contrast
Comparison: None.

CLINICAL DATA: Pt having red and swollen left thumb for a few days
with no injury,pain as well and pain going into 2 finger

EXAM:
LEFT THUMB 2+V

[finger ap]
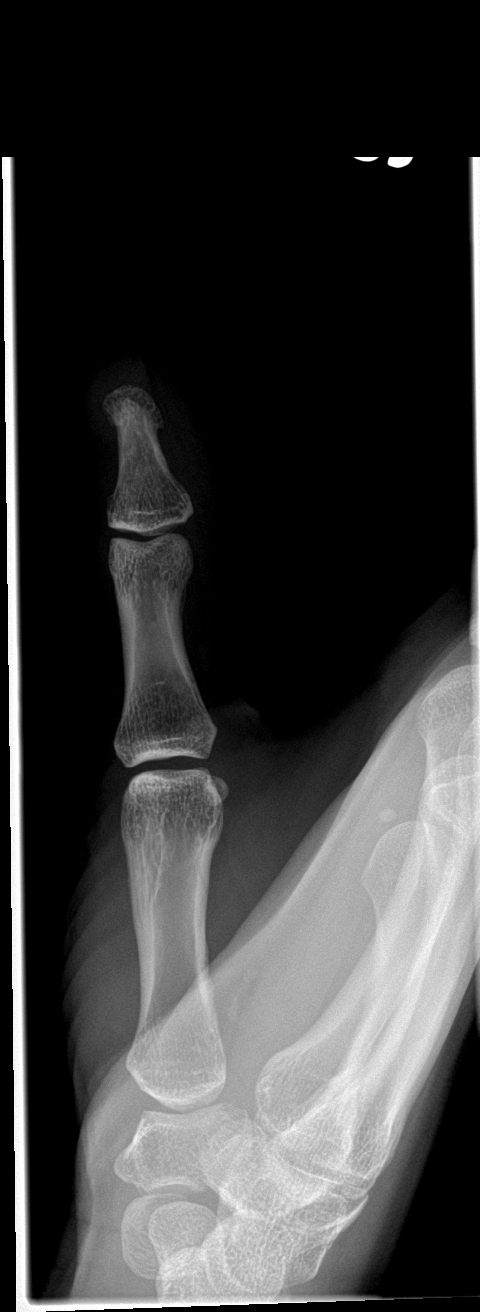

[finger obl]
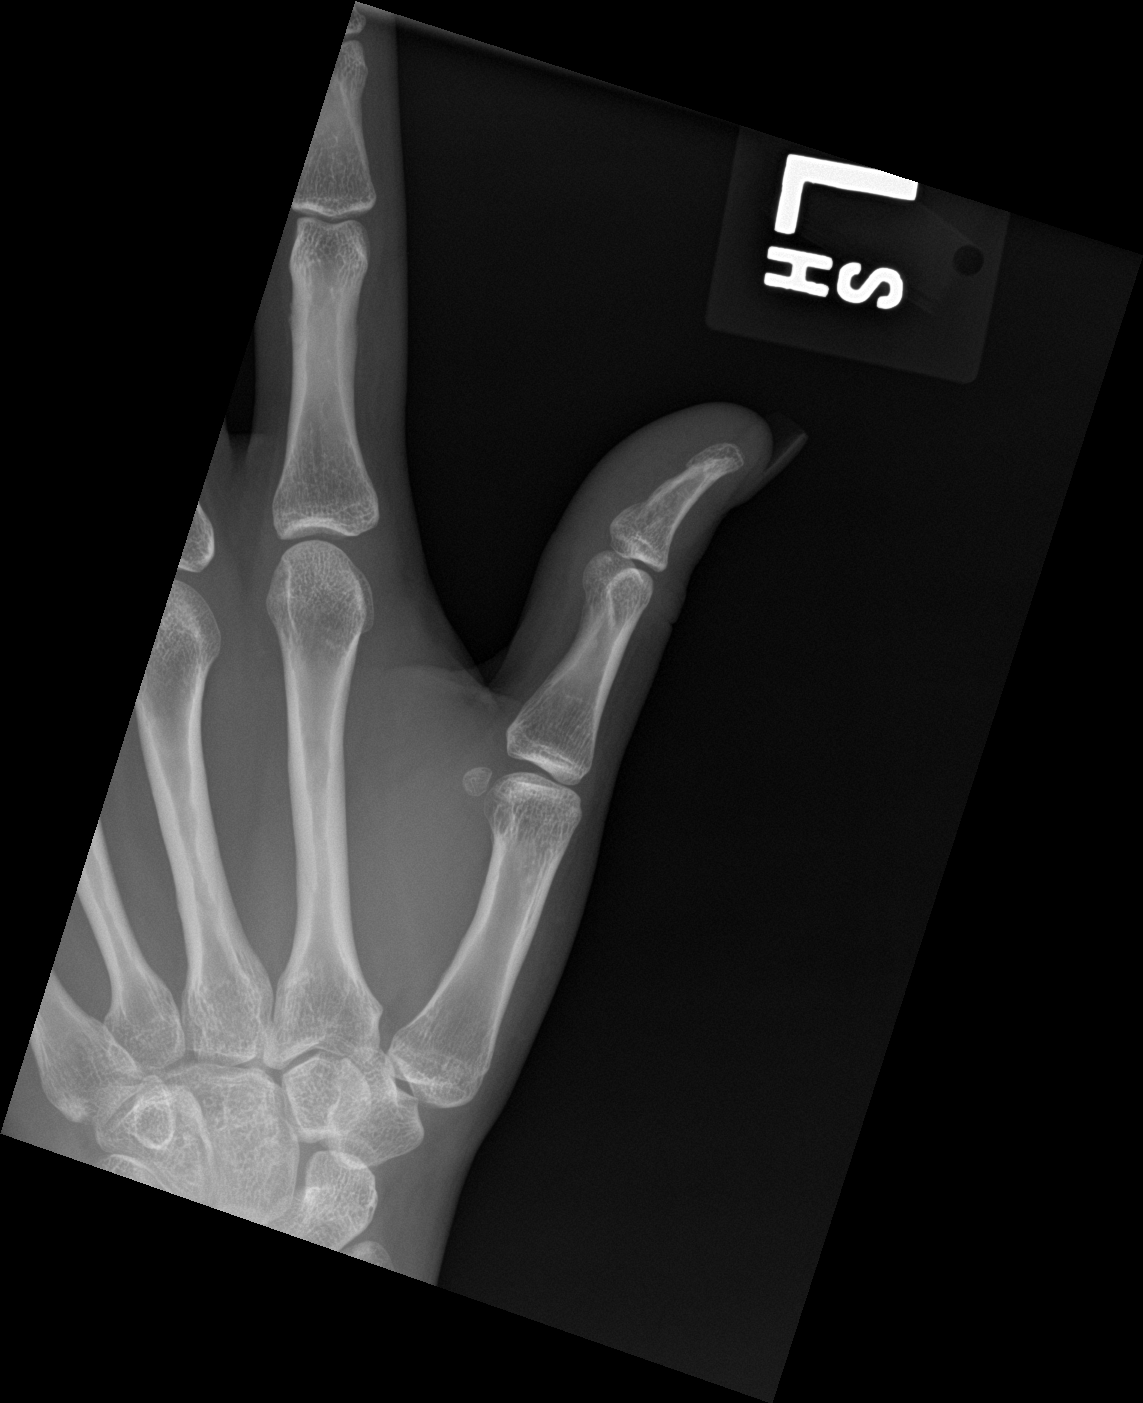

[finger lat]
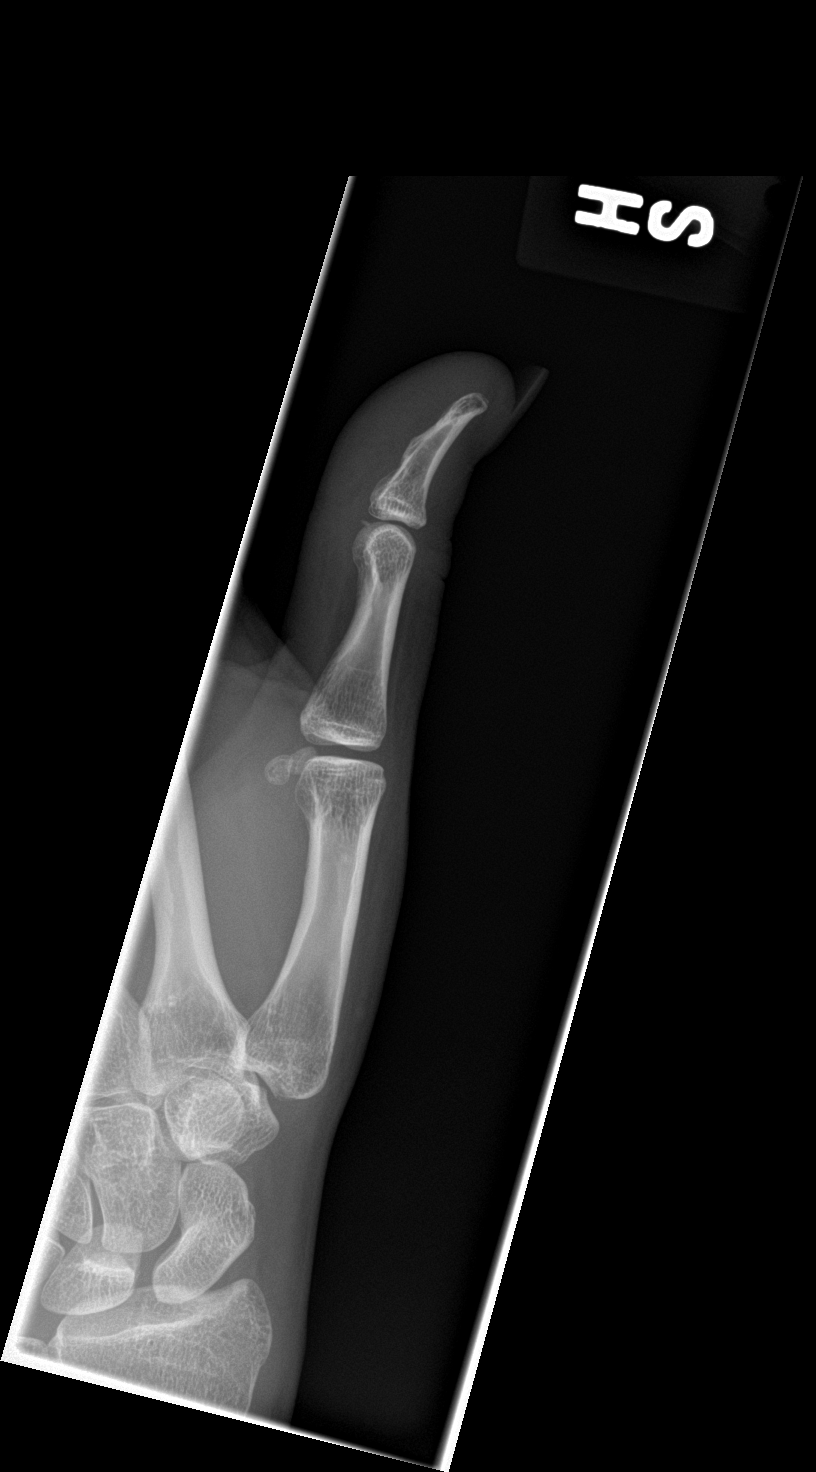

[3 of 3 positions shown; findings below may reference images not displayed]

FINDINGS: There is mild soft tissue swelling. There is no acute fracture or
subluxation. Soft tissue swelling of the distal thumb. A small
radiopaque density measures 2 millimeters in length at the level of
the interphalangeal joint of the thumb. This could represent minimal
degenerative change but a small foreign body cannot be excluded.
There is no associated soft tissue gas.
IMPRESSION: 1. No acute fracture.
2. Soft tissue swelling.
3. Possible radiopaque foreign body versus degenerative change at
the interphalangeal joint as described.

## 2018-07-25 ENCOUNTER — Encounter: Payer: Self-pay | Admitting: Family Medicine

## 2018-07-25 ENCOUNTER — Ambulatory Visit: Payer: Self-pay | Admitting: Family Medicine

## 2018-07-25 ENCOUNTER — Other Ambulatory Visit: Payer: Self-pay

## 2018-07-25 VITALS — BP 110/81 | HR 78 | Temp 98.3°F | Resp 16 | Ht 65.0 in | Wt 158.0 lb

## 2018-07-25 DIAGNOSIS — F32A Depression, unspecified: Secondary | ICD-10-CM

## 2018-07-25 DIAGNOSIS — F329 Major depressive disorder, single episode, unspecified: Secondary | ICD-10-CM

## 2018-07-25 DIAGNOSIS — F419 Anxiety disorder, unspecified: Secondary | ICD-10-CM

## 2018-07-25 NOTE — Patient Instructions (Signed)
Follow up as needed INCREASE the Buspar to 1 tab twice daily Continue to work on stress management- you deserve it and everyone needs an outlet!! Call with any questions or concerns Happy Holidays!!!

## 2018-07-25 NOTE — Assessment & Plan Note (Signed)
Pt's sxs are much better since starting Buspar.  She is having some breakthrough anxiety but remains on 1/2 tab twice daily.  Will increase to 1 tab twice daily and pt will let me know if medication needs to be changed.  Pt expressed understanding and is in agreement w/ plan.

## 2018-07-25 NOTE — Progress Notes (Signed)
   Subjective:    Patient ID: Tasha Hanna, female    DOB: 11-14-1990, 27 y.o.   MRN: [id number]  HPI Anxiety/depression- pt was started on Buspar at last visit.  Is tolerating w/o difficulty.  No N/V, dizziness like with the Citalopram.  Pt reports she is less anxious.  'i'm a lot nicer'.  Able to go out and be around people.  Taking 1/2 tab twice daily.  Pt is interested in increasing dose to even better control anxiety.  Pt is not struggling w/ depression or 'staying down' at this point.   Review of Systems For ROS see HPI     Objective:   Physical Exam  Constitutional: She is oriented to person, place, and time. She appears well-developed and well-nourished. No distress.  HENT:  Head: Normocephalic and atraumatic.  Neurological: She is alert and oriented to person, place, and time.  Skin: Skin is warm and dry.  Psychiatric: She has a normal mood and affect. Her behavior is normal. Thought content normal.  Vitals reviewed.         Assessment & Plan:

## 2019-07-08 ENCOUNTER — Telehealth: Payer: Self-pay | Admitting: Family Medicine

## 2019-07-08 NOTE — Telephone Encounter (Signed)
It has been quite awhile since I have spoken w/ pt about this.  Since things sound like they are worsening, an appt w/ the pt would be the best option rather than talking w/ mom.

## 2019-07-08 NOTE — Telephone Encounter (Signed)
Pt is scheduled for an appt. With Tabori on 07/12/2019

## 2019-07-08 NOTE — Telephone Encounter (Signed)
Since PT is an adult. Should this be an appt with you to discuss?

## 2019-07-08 NOTE — Telephone Encounter (Signed)
Please see if pt wants to see new PCP or Tabori for this. Needs appt

## 2019-07-08 NOTE — Telephone Encounter (Signed)
Pt is scheduled for a VV with Tabori on 07/12/2019 but would still like the TOC

## 2019-07-08 NOTE — Telephone Encounter (Signed)
Please advise on TOC, pt lives closer to this location    Copied from [place] [phone]. Topic: Appointment Scheduling - Transfer of Care >> Jul 08, 2019 10:35 AM Celene Kras wrote: Pt is requesting to transfer FROM: Annye Asa Pt is requesting to transfer TO: Letta Median  Reason for requested transfer: Pt relocating. PEC  Send CRM to patient's current PCP (transferring FROM).

## 2019-07-08 NOTE — Telephone Encounter (Signed)
Mom called in stating that pt is really struggling. She wanted to know a therapist that Dr. Birdie Riddle would recommend for her. She would like to it be a younger female. She states that this was discuss a while ago but has since forgotten. She states that pt now has insurance. Please call mom with this information.

## 2019-07-08 NOTE — Telephone Encounter (Signed)
Ok for pt transfer.  There was another note indicating worsening depression that I recommended a visit for.  Not sure if she wants to do a VV w/ me or w/ Dr Bryan Lemma.

## 2019-07-08 NOTE — Telephone Encounter (Signed)
Pt is requesting TOC to Dr. Bryan Lemma, please advise.

## 2019-07-09 NOTE — Telephone Encounter (Signed)
Ok to transfer after VV appt with Dr. Birdie Riddle that is currently scheduled

## 2019-07-09 NOTE — Telephone Encounter (Signed)
I called and left message on patient voicemail that TOC was approved to see Dr. Bryan Lemma. Also, message left that per Dr. Loletha Grayer, patient is to keep appointment with Dr. Birdie Riddle and [facility] after this appointment. Office phone number left to call office and schedule.

## 2019-07-12 ENCOUNTER — Encounter: Payer: Self-pay | Admitting: Family Medicine

## 2019-07-12 ENCOUNTER — Ambulatory Visit (INDEPENDENT_AMBULATORY_CARE_PROVIDER_SITE_OTHER): Payer: 59 | Admitting: Family Medicine

## 2019-07-12 ENCOUNTER — Other Ambulatory Visit: Payer: Self-pay

## 2019-07-12 DIAGNOSIS — F419 Anxiety disorder, unspecified: Secondary | ICD-10-CM

## 2019-07-12 DIAGNOSIS — F329 Major depressive disorder, single episode, unspecified: Secondary | ICD-10-CM

## 2019-07-12 DIAGNOSIS — F32A Depression, unspecified: Secondary | ICD-10-CM

## 2019-07-12 MED ORDER — ALPRAZOLAM 0.25 MG PO TABS: 0.2500 mg | ORAL_TABLET | Freq: Two times a day (BID) | ORAL | 0 refills | Status: DC | PRN

## 2019-07-12 NOTE — Progress Notes (Signed)
I have discussed the procedure for the virtual visit with the patient who has given consent to proceed with assessment and treatment.   Pt unable to obtain vitals.   States she quit anxiety med during the summer. Recently started  Back up and is so tired she feels as though she cannot make it through the day. Also having panic attacks.   Davis Gourd, CMA

## 2019-07-12 NOTE — Progress Notes (Signed)
   Virtual Visit via Video   I connected with patient on 07/12/19 at 11:30 AM EST by a video enabled telemedicine application and verified that I am speaking with the correct person using two identifiers.  Location patient: Home Location provider: Acup[facility]urist, Office Persons participating in the virtual visit: Patient, Provider, [place] (Jess B)  I discussed the limitations of evaluation and management by telemedicine and the availability of in person appointments. The patient expressed understanding and agreed to proceed.  Subjective:   HPI:   Anxiety- 'it's been bad again'.  Stopped the Buspar over the summer.  Restarted 2 weeks ago at 1/2 tab twice daily.  This is causing excessive fatigue.  Started new job at beginning of month which has been stressful.  Had a panic attack last week- could feel it coming.  'i'm not really good at taking medication every day'.  She reports most days are ok, 'I just need something when it's bad'.  Has taken alprazolam in the past.  Mom wants her to have counseling.  ROS:   See pertinent positives and negatives per HPI.  Patient Active Problem List   Diagnosis Date Noted  . Anxiety and depression 08/03/2015  . Fear of flying 10/07/2014  . Screening for malignant neoplasm of the cervix 07/09/2013  . Gait abnormality 02/06/2012  . Bunion 02/06/2012  . Birth control 01/25/2012  . Leg length inequality 01/25/2012  . Physical exam 04/04/2011  . OTHER PREMATURE BEATS 09/25/2009  . DYSMENORRHEA 04/22/2009  . HEADACHE 06/12/2008    Social History   Tobacco Use  . Smoking status: Never Smoker  . Smokeless tobacco: Never Used  Substance Use Topics  . Alcohol use: Yes    Current Outpatient Medications:  .  busPIRone (BUSPAR) 15 MG tablet, Take 1 tablet (15 mg total) by mouth 2 (two) times daily., Disp: 60 tablet, Rfl: 3 .  etonogestrel (NEXPLANON) 68 MG IMPL implant, 1 each by Subdermal route once., Disp: , Rfl:   Allergies  Allergen  Reactions  . Citalopram Nausea And Vomiting    Objective:   There were no vitals taken for this visit. AAOx3, NAD NCAT, EOMI No obvious CN deficits Coloring WNL Pt is able to speak clearly, coherently without shortness of breath or increased work of breathing.  Thought process is linear.  Mood is appropriate.   Assessment and Plan:   Anxiety- deteriorated.  Pt finds that the Buspar is causing excessive fatigue and isn't working for her.  She prefers to take a medication only as needed.  She has been on Alprazolam in the past.  Will restart low dose PRN.  Will also refer for counseling.  Pt was advised that if she finds herself needing the Alprazolam more days than not, she is to let me know and we will start a daily controller medication.  Pt expressed understanding and is in agreement w/ plan.   Annye Asa, MD 07/12/2019

## 2019-12-05 ENCOUNTER — Other Ambulatory Visit: Payer: Self-pay | Admitting: Family Medicine

## 2019-12-06 ENCOUNTER — Telehealth: Payer: Self-pay | Admitting: General Practice

## 2019-12-06 MED ORDER — ALPRAZOLAM 0.25 MG PO TABS: 0.2500 mg | ORAL_TABLET | Freq: Two times a day (BID) | ORAL | 0 refills | Status: DC | PRN

## 2019-12-06 NOTE — Telephone Encounter (Signed)
This was filled via RX Request

## 2019-12-06 NOTE — Telephone Encounter (Signed)
Last OV 07/12/19 Alprazolam last filled 07/12/19 #30 with 0 

## 2019-12-06 NOTE — Telephone Encounter (Signed)
Last OV 07/12/19 Alprazolam last filled 07/12/19 #30 with 0

## 2019-12-06 NOTE — Telephone Encounter (Signed)
Noted  

## 2020-01-02 ENCOUNTER — Encounter: Payer: Self-pay | Admitting: Family Medicine

## 2020-01-02 ENCOUNTER — Telehealth (INDEPENDENT_AMBULATORY_CARE_PROVIDER_SITE_OTHER): Payer: 59 | Admitting: Family Medicine

## 2020-01-02 VITALS — Ht 65.0 in | Wt 163.6 lb

## 2020-01-02 DIAGNOSIS — F329 Major depressive disorder, single episode, unspecified: Secondary | ICD-10-CM

## 2020-01-02 DIAGNOSIS — F419 Anxiety disorder, unspecified: Secondary | ICD-10-CM

## 2020-01-02 DIAGNOSIS — E663 Overweight: Secondary | ICD-10-CM

## 2020-01-02 DIAGNOSIS — Z6827 Body mass index (BMI) 27.0-27.9, adult: Secondary | ICD-10-CM

## 2020-01-02 DIAGNOSIS — F32A Depression, unspecified: Secondary | ICD-10-CM

## 2020-01-02 DIAGNOSIS — R5383 Other fatigue: Secondary | ICD-10-CM

## 2020-01-02 MED ORDER — HYDROXYZINE PAMOATE 25 MG PO CAPS: 25.0000 mg | ORAL_CAPSULE | Freq: Every evening | ORAL | 1 refills | Status: DC | PRN

## 2020-01-02 NOTE — Progress Notes (Signed)
Virtual Visit via Video Note  I connected with Tasha Hanna on 01/02/20 at  4:00 PM EDT by a video enabled telemedicine application and verified that I am speaking with the correct person using two identifiers. Location patient: home Location provider: work or home office Persons participating in the virtual visit: patient, provider  I discussed the limitations of evaluation and management by telemedicine and the availability of in person appointments. The patient expressed understanding and agreed to proceed.  Chief Complaint  Patient presents with  . Fatigue    Pt c/o headaches, weight lose, not able to focus and not being able to go to sleep.     HPI: Tasha Hanna is a 29 y.o. female seen for [facility], previous PCP Dr. Birdie Riddle. She complains of fatigue, difficulty falling asleep, decreased appetite, "foggy mind", trouble focusing due to fatigue. Symptoms x 1 mo, worse in the past 1 week.   No fever, chills. No n/v/d. No rash. Pt had some low back pain last week but that has resolved. Otherwise denies joint pain, muscle pain. No URI symptoms.  Pt states she has been eating healthier.   She does not feel symptoms are related to depression.   Pt has a h/o anxiety and depression. She has a previous Rx for buspar 15mg  BID but is not taking that. She does take PRN xanax 0.25mg .   She did not tolerate citalopram in the past d/t side effects. She was also on zoloft in the past but 50mg  was not effective and 75mg  caused her to have nausea.   Depression screen [facility] 2/9 01/02/2020 07/12/2019 06/26/2018  Decreased Interest 1 0 2  Down, Depressed, Hopeless 1 0 1  PHQ - 2 Score 2 0 3  Altered sleeping 2 0 3  Tired, decreased energy 3 0 3  Change in appetite 2 0 3  Feeling bad or failure about yourself  0 0 3  Trouble concentrating 3 0 0  Moving slowly or fidgety/restless 1 0 0  Suicidal thoughts 0 0 0  PHQ-9 Score 13 0 15  Difficult doing work/chores Somewhat difficult Not difficult at all  Somewhat difficult   GAD 7 : Generalized Anxiety Score 01/02/2020  Nervous, Anxious, on Edge 3  Control/stop worrying 2  Worry too much - different things 2  Trouble relaxing 3  Restless 0  Easily annoyed or irritable 2  Afraid - awful might happen 3  Total GAD 7 Score 15  Anxiety Difficulty Very difficult   Wt Readings from Last 3 Encounters:  01/02/20 163 lb 9.6 oz (74.2 kg)  07/25/18 158 lb (71.7 kg)  07/03/18 151 lb 8 oz (68.7 kg)   Past Medical History:  Diagnosis Date  . Acne    Dr Onalee Hua NP  . Anxiety     Past Surgical History:  Procedure Laterality Date  . WISDOM TOOTH EXTRACTION      Family History  Problem Relation Age of Onset  . Hypertension Father   . Diabetes Maternal Grandfather   . Heart attack Maternal Grandfather   . Stroke Maternal Grandfather   . Cancer Maternal Grandfather        Bladder  . Prostate cancer Maternal Grandfather   . Diabetes Paternal Grandfather   . Cancer Brother        testicular    Social History   Tobacco Use  . Smoking status: Never Smoker  . Smokeless tobacco: Never Used  Substance Use Topics  . Alcohol use: Yes  . Drug use: No  Current Outpatient Medications:  .  ALPRAZolam (XANAX) 0.25 MG tablet, Take 1 tablet (0.25 mg total) by mouth 2 (two) times daily as needed for anxiety., Disp: 30 tablet, Rfl: 0 .  etonogestrel (NEXPLANON) 68 MG IMPL implant, 1 each by Subdermal route once., Disp: , Rfl:  .  busPIRone (BUSPAR) 15 MG tablet, Take 1 tablet (15 mg total) by mouth 2 (two) times daily. (Patient not taking: Reported on 01/02/2020), Disp: 60 tablet, Rfl: 3  Allergies  Allergen Reactions  . Citalopram Nausea And Vomiting      ROS: See pertinent positives and negatives per HPI.   EXAM:  VITALS per patient if applicable: Ht 5\' 5"  (1.651 m)   Wt 163 lb 9.6 oz (74.2 kg)   BMI 27.22 kg/m    GENERAL: alert, oriented, appears well and in no acute distress  HEENT: atraumatic, conjunctiva clear, no  obvious abnormalities on inspection of external nose and ears  NECK: normal movements of the head and neck  LUNGS: on inspection no signs of respiratory distress, breathing rate appears normal, no obvious gross SOB, gasping or wheezing, no conversational dyspnea  CV: no obvious cyanosis  PSYCH/NEURO: pleasant and cooperative, no obvious depression or anxiety, speech and thought processing grossly intact   ASSESSMENT AND PLAN:  1. Anxiety and depression - PHQ-9 = 13, GAD-7 = 15 - pt takes xanax 0.25mg  PRN (3x/mo per pt) but nothing on a daily basis. I believe symptoms are d/t uncontrolled anxiety and depression, although pt does not fully agree with this. She has trouble falling asleep d/t mind going thru events of the day, thinking about "what if's", etc. She is hesitant/anxious to go to new places (gives example of new grocery store) and avoids doing so. - strongly encouraged pt to pursue BH counseling which she states she knows she needs to do Rx: - hydrOXYzine (VISTARIL) 25 MG capsule; Take 1 capsule (25 mg total) by mouth at bedtime as needed for anxiety.  Dispense: 30 capsule; Refill: 1  2. Fatigue, unspecified type - Basic metabolic panel; Future - CBC; Future - TSH; Future - T4, free; Future - VITAMIN D 25 Hydroxy (Vit-D Deficiency, Fractures); Future - Vitamin B12; Future - Iron, TIBC and Ferritin Panel; Future - Hemoglobin A1c; Future  3. Overweight with body mass index (BMI) of 27 to 27.9 in adult - AST; Future - ALT; Future - Lipid panel; Future - Hemoglobin A1c; Future   I discussed the assessment and treatment plan with the patient. The patient was provided an opportunity to ask questions and all were answered. The patient agreed with the plan and demonstrated an understanding of the instructions.   The patient was advised to call back or seek an in-person evaluation if the symptoms worsen or if the condition fails to improve as anticipated.   ,  DO

## 2020-01-02 NOTE — Patient Instructions (Signed)
There are no preventive care reminders to display for this patient.  Depression screen [facility] 2/9 07/12/2019 06/26/2018 12/23/2016  Decreased Interest 0 2 0  Down, Depressed, Hopeless 0 1 0  PHQ - 2 Score 0 3 0  Altered sleeping 0 3 0  Tired, decreased energy 0 3 0  Change in appetite 0 3 0  Feeling bad or failure about yourself  0 3 0  Trouble concentrating 0 0 0  Moving slowly or fidgety/restless 0 0 0  Suicidal thoughts 0 0 0  PHQ-9 Score 0 15 0  Difficult doing work/chores Not difficult at all Somewhat difficult -

## 2020-01-03 ENCOUNTER — Other Ambulatory Visit: Payer: Self-pay

## 2020-01-03 ENCOUNTER — Encounter: Payer: Self-pay | Admitting: Family Medicine

## 2020-01-03 ENCOUNTER — Other Ambulatory Visit (INDEPENDENT_AMBULATORY_CARE_PROVIDER_SITE_OTHER): Payer: 59

## 2020-01-03 ENCOUNTER — Other Ambulatory Visit: Payer: Self-pay | Admitting: Family Medicine

## 2020-01-03 DIAGNOSIS — Z6827 Body mass index (BMI) 27.0-27.9, adult: Secondary | ICD-10-CM

## 2020-01-03 DIAGNOSIS — E559 Vitamin D deficiency, unspecified: Secondary | ICD-10-CM | POA: Insufficient documentation

## 2020-01-03 DIAGNOSIS — E663 Overweight: Secondary | ICD-10-CM | POA: Diagnosis not present

## 2020-01-03 DIAGNOSIS — R5383 Other fatigue: Secondary | ICD-10-CM | POA: Diagnosis not present

## 2020-01-03 LAB — CBC
HCT: 44 % (ref 36.0–46.0)
Hemoglobin: 14.9 g/dL (ref 12.0–15.0)
MCHC: 33.8 g/dL (ref 30.0–36.0)
MCV: 91.6 fl (ref 78.0–100.0)
Platelets: 255 10*3/uL (ref 150.0–400.0)
RBC: 4.8 Mil/uL (ref 3.87–5.11)
RDW: 12 % (ref 11.5–15.5)
WBC: 6.5 10*3/uL (ref 4.0–10.5)

## 2020-01-03 LAB — LIPID PANEL
Cholesterol: 163 mg/dL (ref 0–200)
HDL: 71.1 mg/dL (ref >39.00)
LDL Cholesterol: 81 mg/dL (ref 0–99)
NonHDL: 91.48
Total CHOL/HDL Ratio: 2
Triglycerides: 50 mg/dL (ref 0.0–149.0)
VLDL: 10 mg/dL (ref 0.0–40.0)

## 2020-01-03 LAB — T4, FREE: Free T4: 1.12 ng/dL (ref 0.60–1.60)

## 2020-01-03 LAB — BASIC METABOLIC PANEL
BUN: 11 mg/dL (ref 6–23)
CO2: 27 mEq/L (ref 19–32)
Calcium: 9.3 mg/dL (ref 8.4–10.5)
Chloride: 100 mEq/L (ref 96–112)
Creatinine, Ser: 0.77 mg/dL (ref 0.40–1.20)
GFR: 89.43 mL/min (ref >60.00)
Glucose, Bld: 83 mg/dL (ref 70–99)
Potassium: 4.3 mEq/L (ref 3.5–5.1)
Sodium: 137 mEq/L (ref 135–145)

## 2020-01-03 LAB — VITAMIN D 25 HYDROXY (VIT D DEFICIENCY, FRACTURES): VITD: 19.94 ng/mL — ABNORMAL LOW (ref 30.00–100.00)

## 2020-01-03 LAB — VITAMIN B12: Vitamin B-12: 854 pg/mL (ref 211–911)

## 2020-01-03 LAB — HEMOGLOBIN A1C: Hgb A1c MFr Bld: 5.1 % (ref 4.6–6.5)

## 2020-01-03 LAB — ALT: ALT: 13 U/L (ref 0–35)

## 2020-01-03 LAB — AST: AST: 17 U/L (ref 0–37)

## 2020-01-03 LAB — TSH: TSH: 0.92 u[IU]/mL (ref 0.35–4.50)

## 2020-01-03 MED ORDER — VITAMIN D (ERGOCALCIFEROL) 1.25 MG (50000 UNIT) PO CAPS: 50000.0000 [IU] | ORAL_CAPSULE | ORAL | 2 refills | Status: DC

## 2020-01-04 LAB — IRON,TIBC AND FERRITIN PANEL
%SAT: 37 % (calc) (ref 16–45)
Ferritin: 72 ng/mL (ref 16–154)
Iron: 136 ug/dL (ref 40–190)
TIBC: 364 mcg/dL (calc) (ref 250–450)

## 2020-01-07 ENCOUNTER — Telehealth: Payer: Self-pay | Admitting: Family Medicine

## 2020-01-07 DIAGNOSIS — F419 Anxiety disorder, unspecified: Secondary | ICD-10-CM

## 2020-01-07 DIAGNOSIS — F32A Depression, unspecified: Secondary | ICD-10-CM

## 2020-01-07 NOTE — Addendum Note (Signed)
Addended by: Geannie Risen on: 01/07/2020 02:25 PM   Modules accepted: Orders

## 2020-01-07 NOTE — Telephone Encounter (Signed)
Referral placed.

## 2020-01-07 NOTE — Telephone Encounter (Signed)
Ok to do psychology referral, Chiropodist, at [employer] [facility] [employer]

## 2020-01-07 NOTE — Telephone Encounter (Signed)
Please advise 

## 2020-01-07 NOTE — Telephone Encounter (Signed)
Patient's mother is calling in this morning asking about there referral to a therapist, states they discussed getting a referral to someone possibly at [place].

## 2020-01-08 ENCOUNTER — Telehealth: Payer: Self-pay | Admitting: Family Medicine

## 2020-01-08 NOTE — Telephone Encounter (Signed)
Mom called in stating that pt can't get in to see Merry Lofty until July. She wanted to know if Tabori recommended anyone else that might could help her daughter. She has found [facility] in [place] wanted to know if Beverely Low has heard about them.   Please advise and call mom back at the cell #

## 2020-01-08 NOTE — Telephone Encounter (Signed)
Tried calling pt mom back. VM states it has not been set up.

## 2020-01-08 NOTE — Telephone Encounter (Signed)
I don't know anything about [facility].  Each counselor/pt relationship is different and it would really depend on what she is looking for and the personality fit.  Most counselors/therapists are booking multiple weeks out- which would take [place] to July (since we're in late May).  It is up to the patient if she wants to wait or schedule at this other practice

## 2020-01-08 NOTE — Telephone Encounter (Signed)
Please advise 

## 2020-01-08 NOTE — Telephone Encounter (Signed)
Made mom aware of this

## 2020-02-14 ENCOUNTER — Ambulatory Visit: Payer: 59 | Admitting: Psychology

## 2020-02-28 ENCOUNTER — Ambulatory Visit: Payer: 59 | Admitting: Psychology

## 2020-02-28 ENCOUNTER — Ambulatory Visit (INDEPENDENT_AMBULATORY_CARE_PROVIDER_SITE_OTHER): Payer: 59 | Admitting: Psychology

## 2020-02-28 ENCOUNTER — Encounter: Payer: 59 | Admitting: Family Medicine

## 2020-02-28 DIAGNOSIS — F331 Major depressive disorder, recurrent, moderate: Secondary | ICD-10-CM

## 2020-02-28 DIAGNOSIS — F411 Generalized anxiety disorder: Secondary | ICD-10-CM

## 2020-03-05 ENCOUNTER — Other Ambulatory Visit: Payer: Self-pay

## 2020-03-06 ENCOUNTER — Ambulatory Visit (INDEPENDENT_AMBULATORY_CARE_PROVIDER_SITE_OTHER): Payer: 59 | Admitting: Family Medicine

## 2020-03-06 ENCOUNTER — Encounter: Payer: Self-pay | Admitting: Family Medicine

## 2020-03-06 ENCOUNTER — Ambulatory Visit (INDEPENDENT_AMBULATORY_CARE_PROVIDER_SITE_OTHER): Payer: 59 | Admitting: Psychology

## 2020-03-06 VITALS — BP 128/90 | HR 87 | Temp 98.0°F | Ht 65.25 in | Wt 166.8 lb

## 2020-03-06 DIAGNOSIS — F419 Anxiety disorder, unspecified: Secondary | ICD-10-CM

## 2020-03-06 DIAGNOSIS — F32A Depression, unspecified: Secondary | ICD-10-CM

## 2020-03-06 DIAGNOSIS — F329 Major depressive disorder, single episode, unspecified: Secondary | ICD-10-CM

## 2020-03-06 DIAGNOSIS — Z0000 Encounter for general adult medical examination without abnormal findings: Secondary | ICD-10-CM | POA: Diagnosis not present

## 2020-03-06 DIAGNOSIS — E559 Vitamin D deficiency, unspecified: Secondary | ICD-10-CM

## 2020-03-06 DIAGNOSIS — F331 Major depressive disorder, recurrent, moderate: Secondary | ICD-10-CM | POA: Diagnosis not present

## 2020-03-06 DIAGNOSIS — F411 Generalized anxiety disorder: Secondary | ICD-10-CM

## 2020-03-06 MED ORDER — BUSPIRONE HCL 7.5 MG PO TABS: 7.5000 mg | ORAL_TABLET | Freq: Two times a day (BID) | ORAL | 3 refills | Status: DC

## 2020-03-06 MED ORDER — ALPRAZOLAM 0.25 MG PO TABS: 0.2500 mg | ORAL_TABLET | Freq: Two times a day (BID) | ORAL | 0 refills | Status: DC | PRN

## 2020-03-06 NOTE — Patient Instructions (Signed)
Health Maintenance Due  Topic Date Due  . Hepatitis C Screening  Never done    Depression screen [facility] 2/9 01/02/2020 07/12/2019 06/26/2018  Decreased Interest 1 0 2  Down, Depressed, Hopeless 1 0 1  PHQ - 2 Score 2 0 3  Altered sleeping 2 0 3  Tired, decreased energy 3 0 3  Change in appetite 2 0 3  Feeling bad or failure about yourself  0 0 3  Trouble concentrating 3 0 0  Moving slowly or fidgety/restless 1 0 0  Suicidal thoughts 0 0 0  PHQ-9 Score 13 0 15  Difficult doing work/chores Somewhat difficult Not difficult at all Somewhat difficult

## 2020-03-06 NOTE — Progress Notes (Signed)
Tasha Hanna is a 29 y.o. female  Chief Complaint  Patient presents with  . Establish Care    Pt here for phyiscal.  She is fasting today.  Pt would like to discuss medications.    HPI: Tasha Hanna is a 29 y.o. female here for annual CPE. She had labs done in 12/2010 - reviewed by me at that time and again today.   Last PAP: 06/2017 - due in 06/2020 - has appt with GYN ([facility]) in 03/2020  Dental: due - insurance does not cover Vision: pt wears contacts, no vision issues/concerns  Med refills needed today? Xanax - she takes 5-6x/mo  She does not feel the hydroxyzine is not effective, takes a while to work, makes her too tired the next day. She wants to go back on daily med for anxiety.  celexa - dizziness, nausea zoloft - no effective buspar - effective   Past Medical History:  Diagnosis Date  . Acne    Dr Onalee Hua NP  . Anxiety     Past Surgical History:  Procedure Laterality Date  . WISDOM TOOTH EXTRACTION      Social History   Socioeconomic History  . Marital status: Single    Spouse name: Not on file  . Number of children: Not on file  . Years of education: Not on file  . Highest education level: Not on file  Occupational History  . Not on file  Tobacco Use  . Smoking status: Never Smoker  . Smokeless tobacco: Never Used  Substance and Sexual Activity  . Alcohol use: Yes  . Drug use: No  . Sexual activity: Never    Birth control/protection: Implant  Other Topics Concern  . Not on file  Social History Narrative   Lives at home with mom, dad, and brother.   Social Determinants of Health   Financial Resource Strain:   . Difficulty of Paying Living Expenses:   Food Insecurity:   . Worried About Charity fundraiser in the Last Year:   . Arboriculturist in the Last Year:   Transportation Needs:   . Film/video editor (Medical):   Marland Kitchen Lack of Transportation (Non-Medical):   Physical Activity:   . Days of Exercise per Week:   .  Minutes of Exercise per Session:   Stress:   . Feeling of Stress :   Social Connections:   . Frequency of Communication with Friends and Family:   . Frequency of Social Gatherings with Friends and Family:   . Attends Religious Services:   . Active Member of Clubs or Organizations:   . Attends Archivist Meetings:   Marland Kitchen Marital Status:   Intimate Partner Violence:   . Fear of Current or Ex-Partner:   . Emotionally Abused:   Marland Kitchen Physically Abused:   . Sexually Abused:     Family History  Problem Relation Age of Onset  . Hypertension Father   . Diabetes Maternal Grandfather   . Heart attack Maternal Grandfather   . Stroke Maternal Grandfather   . Cancer Maternal Grandfather        Bladder  . Prostate cancer Maternal Grandfather   . Diabetes Paternal Grandfather   . Cancer Brother        testicular     Immunization History  Administered Date(s) Administered  . DTP 06/05/1992, 08/07/1992, 10/12/1992, 11/22/1993, 10/25/1996  . Hepatitis B 05/11/1992, 06/05/1992, 01/18/1993  . HiB (PRP-OMP) 06/05/1992, 08/07/1992, 10/12/1992, 11/22/1993  . Influenza  Whole 09/23/2009  . MMR 11/22/1993, 10/25/1996  . Meningococcal Polysaccharide 03/01/2010  . OPV 06/05/1992, 08/05/1992, 10/12/1992, 10/25/1996  . PPD Test 04/04/2011  . Td 04/12/2005  . Tdap 04/04/2011    Outpatient Encounter Medications as of 03/06/2020  Medication Sig  . ALPRAZolam (XANAX) 0.25 MG tablet Take 1 tablet (0.25 mg total) by mouth 2 (two) times daily as needed for anxiety.  Marland Kitchen etonogestrel (NEXPLANON) 68 MG IMPL implant 1 each by Subdermal route once.  . hydrOXYzine (VISTARIL) 25 MG capsule Take 1 capsule (25 mg total) by mouth at bedtime as needed for anxiety.  . Vitamin D, Ergocalciferol, (DRISDOL) 1.25 MG (50000 UNIT) CAPS capsule Take 1 capsule (50,000 Units total) by mouth every 7 (seven) days.   No facility-administered encounter medications on file as of 03/06/2020.     ROS: Gen: no fever, chills    Skin: no rash, itching ENT: no ear pain, ear drainage, nasal congestion, rhinorrhea, sinus pressure, sore throat Eyes: no blurry vision, double vision Resp: no cough, wheeze,SOB Breast: no breast tenderness, no nipple discharge, no breast masses CV: no CP, palpitations, LE edema,  GI: no heartburn, n/v/d/c, abd pain GU: no dysuria, urgency, frequency, hematuria; no vaginal itching, odor, discharge MSK: no joint pain, myalgias, back pain Neuro: no dizziness, headache, weakness, vertigo Psych: + anxiety, depression   Allergies  Allergen Reactions  . Citalopram Nausea And Vomiting    BP 128/90 (BP Location: Left Arm, Patient Position: Sitting, Cuff Size: Normal)   Pulse 87   Temp 98 F (36.7 C) (Temporal)   Ht 5' 5.25" (1.657 m)   Wt 166 lb 12.8 oz (75.7 kg)   SpO2 98%   BMI 27.54 kg/m   BP Readings from Last 3 Encounters:  03/06/20 128/90  07/25/18 110/81  07/03/18 112/84   Pulse Readings from Last 3 Encounters:  03/06/20 87  07/25/18 78  07/03/18 86   Wt Readings from Last 3 Encounters:  03/06/20 166 lb 12.8 oz (75.7 kg)  01/02/20 163 lb 9.6 oz (74.2 kg)  07/25/18 158 lb (71.7 kg)     Physical Exam Constitutional:      General: She is not in acute distress.    Appearance: She is well-developed.  HENT:     Head: Normocephalic and atraumatic.     Right Ear: Tympanic membrane and ear canal normal.     Left Ear: Tympanic membrane and ear canal normal.     Nose: Nose normal.  Eyes:     Conjunctiva/sclera: Conjunctivae normal.     Pupils: Pupils are equal, round, and reactive to light.  Neck:     Thyroid: No thyromegaly.  Cardiovascular:     Rate and Rhythm: Normal rate and regular rhythm.     Heart sounds: Normal heart sounds. No murmur heard.   Pulmonary:     Effort: Pulmonary effort is normal. No respiratory distress.     Breath sounds: Normal breath sounds. No wheezing or rhonchi.  Abdominal:     General: Bowel sounds are normal. There is no  distension.     Palpations: Abdomen is soft. There is no mass.     Tenderness: There is no abdominal tenderness.  Musculoskeletal:     Cervical back: Neck supple.  Lymphadenopathy:     Cervical: No cervical adenopathy.  Skin:    General: Skin is warm and dry.  Neurological:     Mental Status: She is alert and oriented to person, place, and time.     Motor: No  abnormal muscle tone.     Coordination: Coordination normal.  Psychiatric:        Behavior: Behavior normal.      A/P:  1. Annual physical exam - PAP scheduled in 03/2020 - UTD on vision, needs to schedule dental exam - discussed importance of regular CV exercise, healthy diet, adequate sleep - immunizations UTD - labs done in 12/2019 and previously reviewed with patient - next CPE in 1 year  2. Anxiety and depression - takes xanax 0.54m PRN - pt states she takes 5-6x/mo - pt has been tried on zoloft, celexa in the past but not tolerated/effective - resume buspar 7.598mBID, ok to increase to 1557mID after 2wks if needed (past dose was 60m32mD) - f/u in 4 wks or sooner PRN  3. Vitamin D deficiency - taking Vit D 50,000IU weekly and once done this recommend daily OTC Vit D 1000-2000IU daily    This visit occurred during the SARS-CoV-2 public health emergency.  Safety protocols were in place, including screening questions prior to the visit, additional usage of staff PPE, and extensive cleaning of exam room while observing appropriate contact time as indicated for disinfecting solutions.

## 2020-04-03 DIAGNOSIS — F419 Anxiety disorder, unspecified: Secondary | ICD-10-CM | POA: Insufficient documentation

## 2020-04-10 ENCOUNTER — Ambulatory Visit (INDEPENDENT_AMBULATORY_CARE_PROVIDER_SITE_OTHER): Payer: 59 | Admitting: Psychology

## 2020-04-10 DIAGNOSIS — F411 Generalized anxiety disorder: Secondary | ICD-10-CM

## 2020-04-24 ENCOUNTER — Ambulatory Visit: Payer: 59 | Admitting: Psychology

## 2020-05-01 ENCOUNTER — Ambulatory Visit: Payer: 59 | Admitting: Psychology

## 2020-05-11 ENCOUNTER — Encounter: Payer: Self-pay | Admitting: Family Medicine

## 2020-05-22 ENCOUNTER — Ambulatory Visit: Payer: 59 | Admitting: Psychology

## 2020-06-05 ENCOUNTER — Ambulatory Visit (INDEPENDENT_AMBULATORY_CARE_PROVIDER_SITE_OTHER): Payer: 59 | Admitting: Psychology

## 2020-06-05 DIAGNOSIS — F411 Generalized anxiety disorder: Secondary | ICD-10-CM | POA: Diagnosis not present

## 2020-06-19 ENCOUNTER — Ambulatory Visit: Payer: 59 | Admitting: Psychology

## 2020-07-02 ENCOUNTER — Telehealth: Payer: 59 | Admitting: Family Medicine

## 2020-07-03 ENCOUNTER — Ambulatory Visit (INDEPENDENT_AMBULATORY_CARE_PROVIDER_SITE_OTHER): Payer: 59 | Admitting: Psychology

## 2020-07-03 DIAGNOSIS — F411 Generalized anxiety disorder: Secondary | ICD-10-CM

## 2020-07-03 DIAGNOSIS — F331 Major depressive disorder, recurrent, moderate: Secondary | ICD-10-CM | POA: Diagnosis not present

## 2020-07-19 ENCOUNTER — Other Ambulatory Visit: Payer: Self-pay | Admitting: Family Medicine

## 2020-07-19 DIAGNOSIS — F419 Anxiety disorder, unspecified: Secondary | ICD-10-CM

## 2020-07-19 DIAGNOSIS — F32A Depression, unspecified: Secondary | ICD-10-CM

## 2020-07-20 NOTE — Telephone Encounter (Signed)
Tasha Hanna please advise in absence of Dr. Salena Saner  Pt last ov was 03/06/2020  PMP checked last 3 pick up at [facility] were:   03/06/2020--# 30--Dr. C 12/06/19--# 30--Dr. Beverely Low 07/12/2019--#30--Dr. Beverely Low

## 2020-07-24 ENCOUNTER — Ambulatory Visit: Payer: 59 | Admitting: Psychology

## 2020-07-31 ENCOUNTER — Ambulatory Visit: Payer: 59 | Admitting: Psychology

## 2020-08-28 ENCOUNTER — Ambulatory Visit: Payer: 59 | Admitting: Psychology

## 2020-09-03 ENCOUNTER — Other Ambulatory Visit: Payer: Self-pay

## 2020-09-04 ENCOUNTER — Ambulatory Visit (INDEPENDENT_AMBULATORY_CARE_PROVIDER_SITE_OTHER): Payer: BC Managed Care – PPO | Admitting: Family Medicine

## 2020-09-04 DIAGNOSIS — Z5329 Procedure and treatment not carried out because of patient's decision for other reasons: Secondary | ICD-10-CM

## 2020-09-04 NOTE — Progress Notes (Signed)
No show for appt. 

## 2020-09-17 ENCOUNTER — Other Ambulatory Visit: Payer: Self-pay

## 2020-09-17 ENCOUNTER — Telehealth: Payer: Self-pay | Admitting: Family Medicine

## 2020-09-17 ENCOUNTER — Encounter: Payer: Self-pay | Admitting: Family Medicine

## 2020-09-17 NOTE — Telephone Encounter (Signed)
Pt was no show for appt 09/04/2020 for acute. Pt is rescheduled for 09/18/2020. 1st occurrence. Fee waived Letter mailed.

## 2020-09-18 ENCOUNTER — Ambulatory Visit: Payer: BC Managed Care – PPO | Admitting: Family Medicine

## 2020-09-18 DIAGNOSIS — Z0289 Encounter for other administrative examinations: Secondary | ICD-10-CM

## 2020-09-18 NOTE — Progress Notes (Deleted)
   Tasha Hanna is a 30 y.o. female  No chief complaint on file.   HPI: Tasha Hanna is a 29 y.o. female who complains of     Past Medical History:  Diagnosis Date  . Acne    Dr Onalee Hua NP  . Anxiety     Past Surgical History:  Procedure Laterality Date  . WISDOM TOOTH EXTRACTION      Social History   Socioeconomic History  . Marital status: Single    Spouse name: Not on file  . Number of children: Not on file  . Years of education: Not on file  . Highest education level: Not on file  Occupational History  . Not on file  Tobacco Use  . Smoking status: Never Smoker  . Smokeless tobacco: Never Used  Substance and Sexual Activity  . Alcohol use: Yes  . Drug use: No  . Sexual activity: Never    Birth control/protection: Implant  Other Topics Concern  . Not on file  Social History Narrative   Lives at home with mom, dad, and brother.   Social Determinants of Health   Financial Resource Strain: Not on file  Food Insecurity: Not on file  Transportation Needs: Not on file  Physical Activity: Not on file  Stress: Not on file  Social Connections: Not on file  Intimate Partner Violence: Not on file    Family History  Problem Relation Age of Onset  . Hypertension Father   . Diabetes Maternal Grandfather   . Heart attack Maternal Grandfather   . Stroke Maternal Grandfather   . Cancer Maternal Grandfather        Bladder  . Prostate cancer Maternal Grandfather   . Diabetes Paternal Grandfather   . Cancer Brother        testicular     Immunization History  Administered Date(s) Administered  . DTP 06/05/1992, 08/07/1992, 10/12/1992, 11/22/1993, 10/25/1996  . Hepatitis B 05/11/1992, 06/05/1992, 01/18/1993  . HiB (PRP-OMP) 06/05/1992, 08/07/1992, 10/12/1992, 11/22/1993  . Influenza Whole 09/23/2009  . MMR 11/22/1993, 10/25/1996  . Meningococcal Polysaccharide 03/01/2010  . OPV 06/05/1992, 08/05/1992, 10/12/1992, 10/25/1996  . PPD Test 04/04/2011  . Td  04/12/2005  . Tdap 04/04/2011    Outpatient Encounter Medications as of 09/18/2020  Medication Sig  . ALPRAZolam (XANAX) 0.25 MG tablet TAKE 1 TABLET(0.25 MG) BY MOUTH TWICE DAILY AS NEEDED FOR ANXIETY  . busPIRone (BUSPAR) 7.5 MG tablet Take 1 tablet (7.5 mg total) by mouth 2 (two) times daily.  Marland Kitchen etonogestrel (NEXPLANON) 68 MG IMPL implant 1 each by Subdermal route once.  . hydrOXYzine (VISTARIL) 25 MG capsule Take 1 capsule (25 mg total) by mouth at bedtime as needed for anxiety.  . Vitamin D, Ergocalciferol, (DRISDOL) 1.25 MG (50000 UNIT) CAPS capsule Take 1 capsule (50,000 Units total) by mouth every 7 (seven) days.   No facility-administered encounter medications on file as of 09/18/2020.     ROS: Pertinent positives and negatives noted in HPI. Remainder of ROS non-contributory   Allergies  Allergen Reactions  . Citalopram Nausea And Vomiting    There were no vitals taken for this visit.  Physical Exam   A/P:     This visit occurred during the SARS-CoV-2 public health emergency.  Safety protocols were in place, including screening questions prior to the visit, additional usage of staff PPE, and extensive cleaning of exam room while observing appropriate contact time as indicated for disinfecting solutions.

## 2020-09-23 ENCOUNTER — Encounter: Payer: Self-pay | Admitting: Family Medicine

## 2020-09-23 ENCOUNTER — Telehealth: Payer: Self-pay | Admitting: Family Medicine

## 2020-09-23 NOTE — Telephone Encounter (Signed)
Pt was no show for appt 09/18/2020 acute/back pain. 2nd occurrence.  Letter mailed.  PCP,  Please reply back with corresponding letter matching appropriate follow up needs.  A - No follow up necessary B - Follow up urgent - locate patient immediately to schedule appointment. C - Follow up necessary. Contact patient and schedule visit w/in 7 days. D - Follow up necessary. Contact patient and schedule visit w/in 2-4 weeks.  E - Follow up necessary. Contact patient and schedule visit w/in 3 months.

## 2020-09-23 NOTE — Telephone Encounter (Signed)
A 

## 2020-09-30 ENCOUNTER — Other Ambulatory Visit: Payer: Self-pay

## 2020-10-01 ENCOUNTER — Ambulatory Visit: Payer: BC Managed Care – PPO | Admitting: Family Medicine

## 2020-10-01 ENCOUNTER — Encounter: Payer: Self-pay | Admitting: Family Medicine

## 2020-10-01 VITALS — BP 124/76 | HR 100 | Temp 98.4°F | Ht 65.25 in | Wt 161.0 lb

## 2020-10-01 DIAGNOSIS — F32A Depression, unspecified: Secondary | ICD-10-CM

## 2020-10-01 DIAGNOSIS — F419 Anxiety disorder, unspecified: Secondary | ICD-10-CM | POA: Diagnosis not present

## 2020-10-01 MED ORDER — VENLAFAXINE HCL ER 37.5 MG PO CP24: 37.5000 mg | ORAL_CAPSULE | Freq: Every day | ORAL | 3 refills | Status: DC

## 2020-10-01 NOTE — Progress Notes (Signed)
```````````````````````````````````````````````````````````````````````````  Tasha Hanna is a 30 y.o. female  Chief Complaint  Patient presents with  . Follow-up    F/u anxiety/depression and med refills. She hasn't been on any meds for 1 1/2 months and would like to try a different medication.  Declines flu shot.      HPI: Tasha Hanna is a 30 y.o. female   She did not feel these were very effective, slightly helpful. Still trouble falling asleep, mind running/racing. Still physical symptoms of anxiety.  She stopped taking meds in 07/2020.  She has not kept many appts with [facility] for [facility] counseling. Pt is not sure she likes tele-visits. She also admits she struggles to make and keep appts.  Depression screen [facility] 2/9 10/01/2020 01/02/2020 07/12/2019  Decreased Interest 3 1 0  Down, Depressed, Hopeless 2 1 0  PHQ - 2 Score 5 2 0  Altered sleeping 2 2 0  Tired, decreased energy 1 3 0  Change in appetite 3 2 0  Feeling bad or failure about yourself  2 0 0  Trouble concentrating 1 3 0  Moving slowly or fidgety/restless 1 1 0  Suicidal thoughts 1 0 0  PHQ-9 Score 16 13 0  Difficult doing work/chores Very difficult Somewhat difficult Not difficult at all   GAD 7 : Generalized Anxiety Score 10/01/2020 01/02/2020  Nervous, Anxious, on Edge 3 3  Control/stop worrying 3 2  Worry too much - different things 2 2  Trouble relaxing 2 3  Restless 1 0  Easily annoyed or irritable 2 2  Afraid - awful might happen 3 3  Total GAD 7 Score 16 15  Anxiety Difficulty Very difficult Very difficult      Past Medical History:  Diagnosis Date  . Acne    Dr Onalee Hua NP  . Anxiety     Past Surgical History:  Procedure Laterality Date  . WISDOM TOOTH EXTRACTION      Social History   Socioeconomic History  . Marital status: Single    Spouse name: Not on file  . Number of children: Not on file  . Years of education: Not on file  . Highest education level: Not on file  Occupational  History  . Not on file  Tobacco Use  . Smoking status: Never Smoker  . Smokeless tobacco: Never Used  Substance and Sexual Activity  . Alcohol use: Yes  . Drug use: No  . Sexual activity: Never    Birth control/protection: Implant  Other Topics Concern  . Not on file  Social History Narrative   Lives at home with mom, dad, and brother.   Social Determinants of Health   Financial Resource Strain: Not on file  Food Insecurity: Not on file  Transportation Needs: Not on file  Physical Activity: Not on file  Stress: Not on file  Social Connections: Not on file  Intimate Partner Violence: Not on file    Family History  Problem Relation Age of Onset  . Hypertension Father   . Diabetes Maternal Grandfather   . Heart attack Maternal Grandfather   . Stroke Maternal Grandfather   . Cancer Maternal Grandfather        Bladder  . Prostate cancer Maternal Grandfather   . Diabetes Paternal Grandfather   . Cancer Brother        testicular     Immunization History  Administered Date(s) Administered  . DTP 06/05/1992, 08/07/1992, 10/12/1992, 11/22/1993, 10/25/1996  . Hepatitis B 05/11/1992, 06/05/1992, 01/18/1993  . HiB (  PRP-OMP) 06/05/1992, 08/07/1992, 10/12/1992, 11/22/1993  . Influenza Whole 09/23/2009  . MMR 11/22/1993, 10/25/1996  . Meningococcal Polysaccharide 03/01/2010  . Moderna Sars-Covid-2 Vaccination 01/05/2020, 01/31/2020, 07/31/2020  . OPV 06/05/1992, 08/05/1992, 10/12/1992, 10/25/1996  . PPD Test 04/04/2011  . Td 04/12/2005  . Tdap 04/04/2011    Outpatient Encounter Medications as of 10/01/2020  Medication Sig  . ALPRAZolam (XANAX) 0.25 MG tablet TAKE 1 TABLET(0.25 MG) BY MOUTH TWICE DAILY AS NEEDED FOR ANXIETY  . etonogestrel (NEXPLANON) 68 MG IMPL implant 1 each by Subdermal route once.  . busPIRone (BUSPAR) 7.5 MG tablet Take 1 tablet (7.5 mg total) by mouth 2 (two) times daily. (Patient not taking: Reported on 10/01/2020)  . hydrOXYzine (VISTARIL) 25 MG  capsule Take 1 capsule (25 mg total) by mouth at bedtime as needed for anxiety. (Patient not taking: Reported on 10/01/2020)  . Vitamin D, Ergocalciferol, (DRISDOL) 1.25 MG (50000 UNIT) CAPS capsule Take 1 capsule (50,000 Units total) by mouth every 7 (seven) days. (Patient not taking: Reported on 10/01/2020)   No facility-administered encounter medications on file as of 10/01/2020.     ROS: Pertinent positives and negatives noted in HPI. Remainder of ROS non-contributory    Allergies  Allergen Reactions  . Citalopram Nausea And Vomiting    BP 124/76   Pulse 100   Temp 98.4 F (36.9 C) (Temporal)   Ht 5' 5.25" (1.657 m)   Wt 161 lb (73 kg)   SpO2 99%   BMI 26.59 kg/m   Physical Exam Constitutional:      Appearance: Normal appearance. She is normal weight.  Pulmonary:     Effort: No respiratory distress.  Neurological:     General: No focal deficit present.     Mental Status: She is alert and oriented to person, place, and time.  Psychiatric:        Mood and Affect: Mood normal.        Behavior: Behavior normal.      A/P:  1. Anxiety and depression - PHQ-9 = 16, GAD- 7 = 16 - pt has not tolerated celexa, buspar, zoloft, vistaril in the past Rx: - venlafaxine XR (EFFEXOR XR) 37.5 MG 24 hr capsule; Take 1 capsule (37.5 mg total) by mouth at bedtime.  Dispense: 90 capsule; Refill: 3 - stressed the importance of regular appts with [facility] counselor and reminded pt of appt scheduled for 10/16/20 - stressed importance of regular exercise, healthy eating habits as part of self-care and mental health care - f/u in 3-4 wks or sooner PRN Discussed plan and reviewed medications with patient, including risks, benefits, and potential side effects. Pt expressed understand. All questions answered.  I spent 30 min with the patient today discussing symptoms, past treatments, recommendations for and goal of treatment.   This visit occurred during the SARS-CoV-2 public health emergency.   Safety protocols were in place, including screening questions prior to the visit, additional usage of staff PPE, and extensive cleaning of exam room while observing appropriate contact time as indicated for disinfecting solutions.

## 2020-10-01 NOTE — Patient Instructions (Signed)
[facility]: [email or web address]  [facility] [email or web address]  [email or web address]  [facility] [email or web address]  [email or web address]  

## 2020-10-02 ENCOUNTER — Encounter: Payer: Self-pay | Admitting: Family Medicine

## 2020-10-16 ENCOUNTER — Ambulatory Visit (INDEPENDENT_AMBULATORY_CARE_PROVIDER_SITE_OTHER): Payer: BC Managed Care – PPO | Admitting: Psychology

## 2020-10-16 DIAGNOSIS — F411 Generalized anxiety disorder: Secondary | ICD-10-CM | POA: Diagnosis not present

## 2020-10-20 ENCOUNTER — Other Ambulatory Visit: Payer: Self-pay | Admitting: Nurse Practitioner

## 2020-10-20 DIAGNOSIS — F419 Anxiety disorder, unspecified: Secondary | ICD-10-CM

## 2020-10-20 DIAGNOSIS — F32A Depression, unspecified: Secondary | ICD-10-CM

## 2020-10-20 NOTE — Patient Instructions (Addendum)
Health Maintenance Due  Topic Date Due  . Hepatitis C Screening  Never done  . HIV Screening  Never done  . INFLUENZA VACCINE  03/22/2020  . PAP-Cervical Cytology Screening  06/26/2020  . PAP SMEAR-Modifier  06/26/2020    Depression screen [facility] 2/9 10/01/2020 01/02/2020 07/12/2019  Decreased Interest 3 1 0  Down, Depressed, Hopeless 2 1 0  PHQ - 2 Score 5 2 0  Altered sleeping 2 2 0  Tired, decreased energy 1 3 0  Change in appetite 3 2 0  Feeling bad or failure about yourself  2 0 0  Trouble concentrating 1 3 0  Moving slowly or fidgety/restless 1 1 0  Suicidal thoughts 1 0 0  PHQ-9 Score 16 13 0  Difficult doing work/chores Very difficult Somewhat difficult Not difficult at all

## 2020-10-21 NOTE — Telephone Encounter (Signed)
Last OV 10/01/20 Last fill 07/20/20  #30/0

## 2020-10-23 ENCOUNTER — Ambulatory Visit (INDEPENDENT_AMBULATORY_CARE_PROVIDER_SITE_OTHER): Payer: BC Managed Care – PPO | Admitting: Family Medicine

## 2020-10-23 DIAGNOSIS — Z5329 Procedure and treatment not carried out because of patient's decision for other reasons: Secondary | ICD-10-CM

## 2020-10-23 NOTE — Progress Notes (Signed)
No show

## 2020-10-30 ENCOUNTER — Ambulatory Visit (INDEPENDENT_AMBULATORY_CARE_PROVIDER_SITE_OTHER): Payer: BC Managed Care – PPO | Admitting: Psychology

## 2020-10-30 DIAGNOSIS — F411 Generalized anxiety disorder: Secondary | ICD-10-CM | POA: Diagnosis not present

## 2020-11-09 ENCOUNTER — Encounter: Payer: Self-pay | Admitting: Family Medicine

## 2020-11-09 ENCOUNTER — Telehealth: Payer: Self-pay | Admitting: Family Medicine

## 2020-11-09 NOTE — Telephone Encounter (Signed)
Pt was no show for appt 10/23/2020 for OV. 3rd occurrence. 09/04/20, 09/18/20, 10/23/20 Previous letters mailed. Do you want to proceed with dismissal?

## 2020-11-10 ENCOUNTER — Encounter: Payer: Self-pay | Admitting: Family Medicine

## 2020-11-10 DIAGNOSIS — F419 Anxiety disorder, unspecified: Secondary | ICD-10-CM

## 2020-11-10 DIAGNOSIS — F32A Depression, unspecified: Secondary | ICD-10-CM

## 2020-11-10 NOTE — Telephone Encounter (Signed)
Mailing dismissal letter

## 2020-11-10 NOTE — Telephone Encounter (Signed)
Please see message and advise.  Thank you. ° °

## 2020-11-10 NOTE — Telephone Encounter (Signed)
Yes please proceed

## 2020-11-13 MED ORDER — VENLAFAXINE HCL ER 150 MG PO CP24: 150.0000 mg | ORAL_CAPSULE | Freq: Every day | ORAL | 2 refills | Status: AC

## 2020-11-13 NOTE — Addendum Note (Signed)
Addended by: Overton Mam on: 11/13/2020 09:40 AM   Modules accepted: Orders

## 2020-11-13 NOTE — Telephone Encounter (Signed)
Refill sent.

## 2020-11-13 NOTE — Telephone Encounter (Signed)
patient notified VIA phone. Dm/cma  

## 2020-11-18 ENCOUNTER — Encounter: Payer: Self-pay | Admitting: Family Medicine

## 2020-11-18 ENCOUNTER — Telehealth (INDEPENDENT_AMBULATORY_CARE_PROVIDER_SITE_OTHER): Payer: BC Managed Care – PPO | Admitting: Family Medicine

## 2020-11-18 VITALS — Ht 65.25 in

## 2020-11-18 DIAGNOSIS — F419 Anxiety disorder, unspecified: Secondary | ICD-10-CM | POA: Diagnosis not present

## 2020-11-18 DIAGNOSIS — F32A Depression, unspecified: Secondary | ICD-10-CM

## 2020-11-18 DIAGNOSIS — F5104 Psychophysiologic insomnia: Secondary | ICD-10-CM | POA: Diagnosis not present

## 2020-11-18 MED ORDER — TRAZODONE HCL 50 MG PO TABS: 25.0000 mg | ORAL_TABLET | Freq: Every evening | ORAL | 0 refills | Status: DC | PRN

## 2020-11-18 NOTE — Patient Instructions (Signed)
Health Maintenance Due  Topic Date Due  . Hepatitis C Screening  Never done  . HIV Screening  Never done  . INFLUENZA VACCINE  03/22/2020  . PAP-Cervical Cytology Screening  06/26/2020  . PAP SMEAR-Modifier  06/26/2020    Depression screen [facility] 2/9 10/01/2020 01/02/2020 07/12/2019  Decreased Interest 3 1 0  Down, Depressed, Hopeless 2 1 0  PHQ - 2 Score 5 2 0  Altered sleeping 2 2 0  Tired, decreased energy 1 3 0  Change in appetite 3 2 0  Feeling bad or failure about yourself  2 0 0  Trouble concentrating 1 3 0  Moving slowly or fidgety/restless 1 1 0  Suicidal thoughts 1 0 0  PHQ-9 Score 16 13 0  Difficult doing work/chores Very difficult Somewhat difficult Not difficult at all

## 2020-11-18 NOTE — Progress Notes (Signed)
Virtual Visit via Video Note  I connected with Tasha Hanna on 11/18/20 at 10:30 AM EDT by a video enabled telemedicine application and verified that I am speaking with the correct person using two identifiers. Location patient: home Location provider: work or home office Persons participating in the virtual visit: patient, provider  I discussed the limitations of evaluation and management by telemedicine and the availability of in person appointments. The patient expressed understanding and agreed to proceed.  Chief Complaint  Patient presents with  . Follow-up    Medication refill.  Pt would like to discuss not being able to sleep x 46month.    HPI: Tasha Hanna is a 30 y.o. female seen today for medication refill of effexor 150mg  daily. She feels this is helpful in that she is "a lot calmer" and "not having as many depressed thoughts". She missed 2 days and noticed more anger and depression. This resolved with restarting med. She also had Rx for xanax 0.25mg  PRN which she takes 1-2x/wk at most.  She has trouble falling asleep and staying asleep. She has trouble getting back to sleep.  She has taking melatonin x 2 mo, unsure of dose. She has also tried PRN benadryl.  She was tried on hydroxyzine in the past but made her groggy.   Past Medical History:  Diagnosis Date  . Acne    Dr NP  . Anxiety     Past Surgical History:  Procedure Laterality Date  . WISDOM TOOTH EXTRACTION      Family History  Problem Relation Age of Onset  . Hypertension Father   . Diabetes Maternal Grandfather   . Heart attack Maternal Grandfather   . Stroke Maternal Grandfather   . Cancer Maternal Grandfather        Bladder  . Prostate cancer Maternal Grandfather   . Diabetes Paternal Grandfather   . Cancer Brother        testicular    Social History   Tobacco Use  . Smoking status: Never Smoker  . Smokeless tobacco: Never Used  Substance Use Topics  . Alcohol use: Yes  . Drug use:  No     Current Outpatient Medications:  .  ALPRAZolam (XANAX) 0.25 MG tablet, TAKE 1 TABLET(0.25 MG) BY MOUTH TWICE DAILY AS NEEDED FOR ANXIETY, Disp: 30 tablet, Rfl: 0 .  etonogestrel (NEXPLANON) 68 MG IMPL implant, 1 each by Subdermal route once., Disp: , Rfl:  .  venlafaxine XR (EFFEXOR XR) 150 MG 24 hr capsule, Take 1 capsule (150 mg total) by mouth at bedtime., Disp: 30 capsule, Rfl: 2  Allergies  Allergen Reactions  . Citalopram Nausea And Vomiting      ROS: See pertinent positives and negatives per HPI.   EXAM:  VITALS per patient if applicable: Ht 5' 5.25" (1.657 m)   BMI 26.59 kg/m   GENERAL: alert, oriented, appears well and in no acute distress  HEENT: atraumatic, conjunctiva clear, no obvious abnormalities on inspection of external nose and ears  NECK: normal movements of the head and neck  LUNGS: on inspection no signs of respiratory distress, breathing rate appears normal, no obvious gross SOB, gasping or wheezing, no conversational dyspnea  CV: no obvious cyanosis  MS: moves all visible extremities without noticeable abnormality  PSYCH/NEURO: pleasant and cooperative, no obvious depression or anxiety, speech and thought processing grossly intact   ASSESSMENT AND PLAN:  1. Anxiety and depression - improved on effexor 150mg  qHS - cont on current med and dose  2. Psychophysiological insomnia - did not tolerate hydroxyzine d/t sedation - no significant improvement with 2 mo of melatonin Rx: - traZODone (DESYREL) 50 MG tablet; Take 0.5-1 tablets (25-50 mg total) by mouth at bedtime as needed for sleep.  Dispense: 90 tablet; Refill: 0    I discussed the assessment and treatment plan with the patient. The patient was provided an opportunity to ask questions and all were answered. The patient agreed with the plan and demonstrated an understanding of the instructions.   The patient was advised to call back or seek an in-person evaluation if the symptoms  worsen or if the condition fails to improve as anticipated.   Luana Shu, DO

## 2020-11-20 ENCOUNTER — Ambulatory Visit (INDEPENDENT_AMBULATORY_CARE_PROVIDER_SITE_OTHER): Payer: BC Managed Care – PPO | Admitting: Psychology

## 2020-11-20 DIAGNOSIS — F411 Generalized anxiety disorder: Secondary | ICD-10-CM

## 2020-12-04 ENCOUNTER — Ambulatory Visit: Payer: BC Managed Care – PPO | Admitting: Psychology

## 2020-12-11 ENCOUNTER — Other Ambulatory Visit: Payer: Self-pay

## 2020-12-11 ENCOUNTER — Ambulatory Visit (INDEPENDENT_AMBULATORY_CARE_PROVIDER_SITE_OTHER): Payer: BC Managed Care – PPO | Admitting: Adult Health

## 2020-12-11 ENCOUNTER — Encounter: Payer: Self-pay | Admitting: Adult Health

## 2020-12-11 VITALS — BP 116/64 | HR 92 | Ht 65.0 in | Wt 168.0 lb

## 2020-12-11 DIAGNOSIS — F4010 Social phobia, unspecified: Secondary | ICD-10-CM | POA: Diagnosis not present

## 2020-12-11 DIAGNOSIS — G4700 Insomnia, unspecified: Secondary | ICD-10-CM | POA: Diagnosis not present

## 2020-12-11 DIAGNOSIS — F331 Major depressive disorder, recurrent, moderate: Secondary | ICD-10-CM | POA: Diagnosis not present

## 2020-12-11 DIAGNOSIS — F419 Anxiety disorder, unspecified: Secondary | ICD-10-CM

## 2020-12-11 DIAGNOSIS — F32A Depression, unspecified: Secondary | ICD-10-CM

## 2020-12-11 DIAGNOSIS — F411 Generalized anxiety disorder: Secondary | ICD-10-CM | POA: Diagnosis not present

## 2020-12-11 DIAGNOSIS — F5104 Psychophysiologic insomnia: Secondary | ICD-10-CM

## 2020-12-11 MED ORDER — VENLAFAXINE HCL ER 37.5 MG PO CP24: 37.5000 mg | ORAL_CAPSULE | Freq: Every day | ORAL | 2 refills | Status: AC

## 2020-12-11 MED ORDER — TRAZODONE HCL 50 MG PO TABS: 25.0000 mg | ORAL_TABLET | Freq: Every evening | ORAL | 0 refills | Status: AC | PRN

## 2020-12-11 MED ORDER — ALPRAZOLAM 0.25 MG PO TABS: 0.2500 mg | ORAL_TABLET | Freq: Two times a day (BID) | ORAL | 2 refills | Status: AC

## 2020-12-11 MED ORDER — FLUOXETINE HCL 20 MG PO CAPS: 20.0000 mg | ORAL_CAPSULE | Freq: Every day | ORAL | 2 refills | Status: AC

## 2020-12-11 MED ORDER — VENLAFAXINE HCL ER 75 MG PO CP24
75.0000 mg | ORAL_CAPSULE | Freq: Every day | ORAL | 2 refills | Status: AC
Start: 2020-12-11 — End: ?

## 2020-12-11 NOTE — Progress Notes (Signed)
[facility] MD/PA/NP Initial Note  12/11/2020 10:37 AM Tasha Hanna  MRN:  [id number]  Chief Complaint:   HPI:   Referred by Merry Lofty.  Describes mood today as "not very good". Pleasant. Tearful at times. Mood symptoms - reports depression, anxiety, and irritability. More anxious overall. Notes social anxiety - not wanting to be around people she doesn't know. Going to familiar places - "Not wanting to branch out to the unknown". 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 Mind constantly racing. Control issues. Reliving past trauma. Very organized at work. Has to have things a certain way. Won't let anyone cook - "they will do it wrong". State "if things get done, it has to be my way". Won't let brother feed the animals. PCP started her on Effexor XR and she would like to try another medication. Having "terible" withdraws when missing a dose and it does not help with her anxiety. Stable interest and motivation. Taking medications as prescribed.  Energy levels lower. Active, does not have a regular exercise routine.  Enjoys some usual interests and activities. Reading. Kayaking. Single. Not dating. Lives with brother - dog and a cat. Parents local. Spending time with family. Appetite adequate. Weight stable - 168 pounds. Sleeps better some nights than others. Currently taking Trazadone at bedtime without any success. Averages 5 hours - "mind racing". Focus and concentration difficulties. Completing tasks. Managing aspects of household. 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 Denies SI or HI.  Denies AH or VH.  Previous medication trials: Effexor, Sertraline, Buspar, Celexa   Visit Diagnosis:    ICD-10-CM   1. Insomnia, unspecified type  G47.00 traZODone (DESYREL) 50 MG tablet  2. Generalized anxiety disorder  F41.1 FLUoxetine (PROZAC) 20 MG capsule    ALPRAZolam (XANAX) 0.25 MG tablet    venlafaxine XR (EFFEXOR XR) 75 MG 24 hr capsule    venlafaxine XR (EFFEXOR XR) 37.5 MG 24  hr capsule  3. Major depressive disorder, recurrent episode, moderate (HCC)  F33.1 FLUoxetine (PROZAC) 20 MG capsule    venlafaxine XR (EFFEXOR XR) 75 MG 24 hr capsule    venlafaxine XR (EFFEXOR XR) 37.5 MG 24 hr capsule  4. Social anxiety disorder  F40.10 FLUoxetine (PROZAC) 20 MG capsule    venlafaxine XR (EFFEXOR XR) 75 MG 24 hr capsule    venlafaxine XR (EFFEXOR XR) 37.5 MG 24 hr capsule  5. Anxiety and depression  F41.9    F32.A   6. Psychophysiological insomnia  F51.04 traZODone (DESYREL) 50 MG tablet    Past Psychiatric History: Denies psychiatric hospitalization.  Past Medical History:  Past Medical History:  Diagnosis Date  . Acne    Dr Sherryl Barters NP  . Anxiety     Past Surgical History:  Procedure Laterality Date  . WISDOM TOOTH EXTRACTION      Family Psychiatric History: Family history of mental illness - Depression.  Family History:  Family History  Problem Relation Age of Onset  . Hypertension Father   . Diabetes Maternal Grandfather   . Heart attack Maternal Grandfather   . Stroke Maternal Grandfather   . Cancer Maternal Grandfather        Bladder  . Prostate cancer Maternal Grandfather   . Diabetes Paternal Grandfather   . Cancer Brother        testicular    Social History:  Social History   Socioeconomic History  . Marital status: Single    Spouse name: Not on file  . Number of  children: Not on file  . Years of education: Not on file  . Highest education level: Not on file  Occupational History  . Not on file  Tobacco Use  . Smoking status: Never Smoker  . Smokeless tobacco: Never Used  Substance and Sexual Activity  . Alcohol use: Yes  . Drug use: No  . Sexual activity: Never    Birth control/protection: Implant  Other Topics Concern  . Not on file  Social History Narrative   Lives at home with mom, dad, and brother.   Social Determinants of Health   Financial Resource Strain: Not on file  Food Insecurity: Not on file   Transportation Needs: Not on file  Physical Activity: Not on file  Stress: Not on file  Social Connections: Not on file    Allergies:  Allergies  Allergen Reactions  . Citalopram Nausea And Vomiting    Metabolic Disorder Labs: Lab Results  Component Value Date   HGBA1C 5.1 01/03/2020   No results found for: PROLACTIN Lab Results  Component Value Date   CHOL 163 01/03/2020   TRIG 50.0 01/03/2020   HDL 71.10 01/03/2020   CHOLHDL 2 01/03/2020   VLDL 10.0 01/03/2020   LDLCALC 81 01/03/2020   LDLCALC 84 08/14/2015   Lab Results  Component Value Date   TSH 0.92 01/03/2020   TSH 1.94 08/14/2015    Therapeutic Level Labs: No results found for: LITHIUM No results found for: VALPROATE No components found for:  CBMZ  Current Medications: Current Outpatient Medications  Medication Sig Dispense Refill  . FLUoxetine (PROZAC) 20 MG capsule Take 1 capsule (20 mg total) by mouth daily. 30 capsule 2  . venlafaxine XR (EFFEXOR XR) 37.5 MG 24 hr capsule Take 1 capsule (37.5 mg total) by mouth daily with breakfast. 30 capsule 2  . venlafaxine XR (EFFEXOR XR) 75 MG 24 hr capsule Take 1 capsule (75 mg total) by mouth daily with breakfast. 30 capsule 2  . ALPRAZolam (XANAX) 0.25 MG tablet Take 1 tablet (0.25 mg total) by mouth 2 (two) times daily. 60 tablet 2  . etonogestrel (NEXPLANON) 68 MG IMPL implant 1 each by Subdermal route once.    . traZODone (DESYREL) 50 MG tablet Take 0.5-1 tablets (25-50 mg total) by mouth at bedtime as needed for sleep. 90 tablet 0  . venlafaxine XR (EFFEXOR XR) 150 MG 24 hr capsule Take 1 capsule (150 mg total) by mouth at bedtime. 30 capsule 2   No current facility-administered medications for this visit.    Medication Side Effects: none  Orders placed this visit:  No orders of the defined types were placed in this encounter.   Psychiatric Specialty Exam:  Review of Systems  Musculoskeletal: Negative for gait problem.  Neurological: Negative for  tremors.  Psychiatric/Behavioral:       Please refer to HPI    Blood pressure 116/64, pulse 92, height 5\' 5"  (1.651 m), weight 168 lb (76.2 kg).Body mass index is 27.96 kg/m.  General Appearance: Casual and Neat  Eye Contact:  Good  Speech:  Clear and Coherent and Normal Rate  Volume:  Normal  Mood:  Anxious, Depressed and Irritable  Affect:  Appropriate and Congruent  Thought Process:  Coherent and Descriptions of Associations: Intact  Orientation:  Full (Time, Place, and Person)  Thought Content: Logical   Suicidal Thoughts:  No  Homicidal Thoughts:  No  Memory:  WNL  Judgement:  Good  Insight:  Good  Psychomotor Activity:  Normal  Concentration:  Concentration: Good  Recall:  Good  Fund of Knowledge: Good  Language: Good  Assets:  Communication Skills Desire for Improvement Financial Resources/Insurance Housing Intimacy Leisure Time Physical Health Resilience Social Support Talents/Skills Transportation Vocational/Educational  ADL's:  Intact  Cognition: WNL  Prognosis:  Good   Screenings:  GAD-7   Flowsheet Row Office Visit from 10/01/2020 in [facility] Video Visit from 01/02/2020 in [facility]  Total GAD-7 Score 16 15    PHQ2-9   Flowsheet Row Office Visit from 10/01/2020 in [facility] Video Visit from 01/02/2020 in [facility] Office Visit from 07/12/2019 in [facility] Office Visit from 06/26/2018 in [facility] Office Visit from 12/23/2016 in [facility]  PHQ-2 Total Score 5 2 0 3 0  PHQ-9 Total Score 16 13 0 15 0      Receiving Psychotherapy: No   Treatment Plan/Recommendations:   Plan:  PDMP reviewed  1. Trazadone 50mg  - 2 at hs 2. Xanax 0.25mg  BID as needed - taking occasionally 3. D/C Effexor XR 150mg  every morning - will taper off - given  written instructions 112.5 x 14 dats, 75mg  x 14 days, then 37.5mg  x 14 days, then d/c. 4. Add Prozac 20mg   Time spent with patient was 60 minutes. Greater than 50% of face to face time with patient was spent on counseling and coordination of care.   RTC 4 weeks  Patient advised to contact office with any questions, adverse effects, or acute worsening in signs and symptoms.      , NP

## 2020-12-18 ENCOUNTER — Ambulatory Visit (INDEPENDENT_AMBULATORY_CARE_PROVIDER_SITE_OTHER): Payer: BC Managed Care – PPO | Admitting: Psychology

## 2020-12-18 DIAGNOSIS — F411 Generalized anxiety disorder: Secondary | ICD-10-CM | POA: Diagnosis not present

## 2020-12-18 DIAGNOSIS — F331 Major depressive disorder, recurrent, moderate: Secondary | ICD-10-CM | POA: Diagnosis not present

## 2021-01-01 ENCOUNTER — Ambulatory Visit (INDEPENDENT_AMBULATORY_CARE_PROVIDER_SITE_OTHER): Payer: BC Managed Care – PPO | Admitting: Psychology

## 2021-01-01 DIAGNOSIS — F411 Generalized anxiety disorder: Secondary | ICD-10-CM

## 2021-01-15 ENCOUNTER — Ambulatory Visit (INDEPENDENT_AMBULATORY_CARE_PROVIDER_SITE_OTHER): Payer: BC Managed Care – PPO | Admitting: Psychology

## 2021-01-15 DIAGNOSIS — F411 Generalized anxiety disorder: Secondary | ICD-10-CM | POA: Diagnosis not present

## 2021-01-22 ENCOUNTER — Ambulatory Visit: Payer: BC Managed Care – PPO | Admitting: Adult Health

## 2021-01-29 ENCOUNTER — Ambulatory Visit: Payer: BC Managed Care – PPO | Admitting: Psychology

## 2021-02-12 ENCOUNTER — Ambulatory Visit (INDEPENDENT_AMBULATORY_CARE_PROVIDER_SITE_OTHER): Payer: Self-pay | Admitting: Psychology

## 2021-02-12 DIAGNOSIS — F411 Generalized anxiety disorder: Secondary | ICD-10-CM

## 2021-02-26 ENCOUNTER — Ambulatory Visit: Payer: BC Managed Care – PPO | Admitting: Psychology

## 2021-03-09 ENCOUNTER — Ambulatory Visit: Payer: Self-pay | Admitting: Psychology

## 2021-03-23 ENCOUNTER — Ambulatory Visit: Payer: Self-pay | Admitting: Psychology

## 2021-04-20 ENCOUNTER — Ambulatory Visit: Payer: Self-pay | Admitting: Psychology

## 2021-07-18 DIAGNOSIS — R030 Elevated blood-pressure reading, without diagnosis of hypertension: Secondary | ICD-10-CM | POA: Diagnosis not present

## 2021-07-18 DIAGNOSIS — J029 Acute pharyngitis, unspecified: Secondary | ICD-10-CM | POA: Diagnosis not present

## 2021-08-23 DIAGNOSIS — R109 Unspecified abdominal pain: Secondary | ICD-10-CM | POA: Diagnosis not present

## 2021-08-23 DIAGNOSIS — R112 Nausea with vomiting, unspecified: Secondary | ICD-10-CM | POA: Diagnosis not present

## 2021-09-07 DIAGNOSIS — I10 Essential (primary) hypertension: Secondary | ICD-10-CM | POA: Diagnosis not present

## 2021-09-07 DIAGNOSIS — R1114 Bilious vomiting: Secondary | ICD-10-CM | POA: Diagnosis not present

## 2021-09-07 DIAGNOSIS — R1013 Epigastric pain: Secondary | ICD-10-CM | POA: Diagnosis not present

## 2021-09-07 DIAGNOSIS — D751 Secondary polycythemia: Secondary | ICD-10-CM | POA: Diagnosis not present

## 2021-09-17 DIAGNOSIS — E8889 Other specified metabolic disorders: Secondary | ICD-10-CM | POA: Diagnosis not present

## 2021-09-17 DIAGNOSIS — R109 Unspecified abdominal pain: Secondary | ICD-10-CM | POA: Diagnosis not present

## 2021-09-17 DIAGNOSIS — K760 Fatty (change of) liver, not elsewhere classified: Secondary | ICD-10-CM | POA: Diagnosis not present

## 2021-09-22 DIAGNOSIS — R197 Diarrhea, unspecified: Secondary | ICD-10-CM | POA: Diagnosis not present

## 2021-09-22 DIAGNOSIS — R748 Abnormal levels of other serum enzymes: Secondary | ICD-10-CM | POA: Diagnosis not present

## 2021-09-22 DIAGNOSIS — R1114 Bilious vomiting: Secondary | ICD-10-CM | POA: Diagnosis not present

## 2021-10-06 DIAGNOSIS — R1011 Right upper quadrant pain: Secondary | ICD-10-CM | POA: Diagnosis not present

## 2021-10-06 DIAGNOSIS — K760 Fatty (change of) liver, not elsewhere classified: Secondary | ICD-10-CM | POA: Diagnosis not present

## 2021-10-06 DIAGNOSIS — R7989 Other specified abnormal findings of blood chemistry: Secondary | ICD-10-CM | POA: Diagnosis not present

## 2021-10-06 DIAGNOSIS — R748 Abnormal levels of other serum enzymes: Secondary | ICD-10-CM | POA: Diagnosis not present

## 2021-11-08 DIAGNOSIS — R197 Diarrhea, unspecified: Secondary | ICD-10-CM | POA: Diagnosis not present

## 2021-12-31 DIAGNOSIS — R1114 Bilious vomiting: Secondary | ICD-10-CM | POA: Diagnosis not present

## 2021-12-31 DIAGNOSIS — K648 Other hemorrhoids: Secondary | ICD-10-CM | POA: Diagnosis not present

## 2021-12-31 DIAGNOSIS — R197 Diarrhea, unspecified: Secondary | ICD-10-CM | POA: Diagnosis not present

## 2021-12-31 DIAGNOSIS — R112 Nausea with vomiting, unspecified: Secondary | ICD-10-CM | POA: Diagnosis not present

## 2022-01-12 DIAGNOSIS — K760 Fatty (change of) liver, not elsewhere classified: Secondary | ICD-10-CM | POA: Diagnosis not present

## 2022-01-12 DIAGNOSIS — K7689 Other specified diseases of liver: Secondary | ICD-10-CM | POA: Diagnosis not present

## 2022-01-12 DIAGNOSIS — R197 Diarrhea, unspecified: Secondary | ICD-10-CM | POA: Diagnosis not present

## 2022-01-12 DIAGNOSIS — R1114 Bilious vomiting: Secondary | ICD-10-CM | POA: Diagnosis not present

## 2022-01-27 DIAGNOSIS — K3189 Other diseases of stomach and duodenum: Secondary | ICD-10-CM | POA: Diagnosis not present

## 2022-01-27 DIAGNOSIS — R112 Nausea with vomiting, unspecified: Secondary | ICD-10-CM | POA: Diagnosis not present

## 2022-03-02 DIAGNOSIS — K760 Fatty (change of) liver, not elsewhere classified: Secondary | ICD-10-CM | POA: Diagnosis not present

## 2022-03-02 DIAGNOSIS — R1114 Bilious vomiting: Secondary | ICD-10-CM | POA: Diagnosis not present

## 2022-03-02 DIAGNOSIS — R197 Diarrhea, unspecified: Secondary | ICD-10-CM | POA: Diagnosis not present

## 2022-03-02 DIAGNOSIS — R748 Abnormal levels of other serum enzymes: Secondary | ICD-10-CM | POA: Diagnosis not present

## 2022-03-09 DIAGNOSIS — R1114 Bilious vomiting: Secondary | ICD-10-CM | POA: Diagnosis not present

## 2022-03-09 DIAGNOSIS — R748 Abnormal levels of other serum enzymes: Secondary | ICD-10-CM | POA: Diagnosis not present

## 2022-03-09 DIAGNOSIS — R112 Nausea with vomiting, unspecified: Secondary | ICD-10-CM | POA: Diagnosis not present

## 2022-08-11 DIAGNOSIS — R051 Acute cough: Secondary | ICD-10-CM | POA: Diagnosis not present

## 2022-08-11 DIAGNOSIS — J101 Influenza due to other identified influenza virus with other respiratory manifestations: Secondary | ICD-10-CM | POA: Diagnosis not present

## 2022-08-11 DIAGNOSIS — R067 Sneezing: Secondary | ICD-10-CM | POA: Diagnosis not present

## 2022-08-11 DIAGNOSIS — R5383 Other fatigue: Secondary | ICD-10-CM | POA: Diagnosis not present

## 2022-08-11 DIAGNOSIS — R5381 Other malaise: Secondary | ICD-10-CM | POA: Diagnosis not present

## 2022-08-17 DIAGNOSIS — K529 Noninfective gastroenteritis and colitis, unspecified: Secondary | ICD-10-CM | POA: Diagnosis not present

## 2022-08-17 DIAGNOSIS — I10 Essential (primary) hypertension: Secondary | ICD-10-CM | POA: Diagnosis not present

## 2023-01-05 ENCOUNTER — Other Ambulatory Visit: Payer: Self-pay

## 2023-01-05 ENCOUNTER — Emergency Department (HOSPITAL_BASED_OUTPATIENT_CLINIC_OR_DEPARTMENT_OTHER)
Admission: EM | Admit: 2023-01-05 | Discharge: 2023-01-06 | Discharge status: Against Medical Advice | Payer: BLUE CROSS/BLUE SHIELD | Attending: Emergency Medicine | Admitting: Emergency Medicine

## 2023-01-05 ENCOUNTER — Encounter (HOSPITAL_BASED_OUTPATIENT_CLINIC_OR_DEPARTMENT_OTHER): Payer: Self-pay | Admitting: Emergency Medicine

## 2023-01-05 ENCOUNTER — Emergency Department (HOSPITAL_BASED_OUTPATIENT_CLINIC_OR_DEPARTMENT_OTHER): Payer: BLUE CROSS/BLUE SHIELD

## 2023-01-05 DIAGNOSIS — R079 Chest pain, unspecified: Secondary | ICD-10-CM | POA: Insufficient documentation

## 2023-01-05 DIAGNOSIS — I10 Essential (primary) hypertension: Secondary | ICD-10-CM | POA: Diagnosis not present

## 2023-01-05 DIAGNOSIS — R112 Nausea with vomiting, unspecified: Secondary | ICD-10-CM | POA: Insufficient documentation

## 2023-01-05 DIAGNOSIS — Z5321 Procedure and treatment not carried out due to patient leaving prior to being seen by health care provider: Secondary | ICD-10-CM | POA: Diagnosis not present

## 2023-01-05 DIAGNOSIS — R0602 Shortness of breath: Secondary | ICD-10-CM | POA: Insufficient documentation

## 2023-01-05 NOTE — ED Triage Notes (Signed)
Pt reports new onset CP that started at 2100 tonight, reports n/v, ShoB, & dizziness; hx of HTN, has not been taking BP meds, states "I forget"
